# Patient Record
Sex: Female | Born: 1945 | Race: Black or African American | Hispanic: No | Marital: Married | Smoking: Never smoker
Health system: Southern US, Community
[De-identification: ages and names within clinical notes are randomized; demographics above are authoritative.]

## PROBLEM LIST (undated history)

## (undated) DIAGNOSIS — I639 Cerebral infarction, unspecified: Secondary | ICD-10-CM

## (undated) DIAGNOSIS — R519 Headache, unspecified: Secondary | ICD-10-CM

## (undated) DIAGNOSIS — R51 Headache: Secondary | ICD-10-CM

## (undated) DIAGNOSIS — E119 Type 2 diabetes mellitus without complications: Secondary | ICD-10-CM

## (undated) DIAGNOSIS — I10 Essential (primary) hypertension: Secondary | ICD-10-CM

## (undated) DIAGNOSIS — H539 Unspecified visual disturbance: Secondary | ICD-10-CM

## (undated) HISTORY — DX: Cerebral infarction, unspecified: I63.9

## (undated) HISTORY — DX: Unspecified visual disturbance: H53.9

## (undated) HISTORY — DX: Type 2 diabetes mellitus without complications: E11.9

## (undated) HISTORY — PX: CATARACT EXTRACTION: SUR2

## (undated) HISTORY — DX: Headache: R51

## (undated) HISTORY — PX: APPENDECTOMY: SHX54

## (undated) HISTORY — DX: Headache, unspecified: R51.9

## (undated) HISTORY — DX: Essential (primary) hypertension: I10

## (undated) HISTORY — PX: PARTIAL HYSTERECTOMY: SHX80

---

## 2014-10-04 ENCOUNTER — Telehealth: Payer: Self-pay | Admitting: Neurology

## 2014-10-04 NOTE — Telephone Encounter (Signed)
Pt called in needs a refill on Labetalol 100 mg to [facility] in [place]. She is scheduled for appointment with Dr Epimenio Foot on 11/01/14 Please call home number Thanks dg

## 2014-10-04 NOTE — Telephone Encounter (Signed)
I have spoken with Alicia Mcintyre.  She is a former pt. of Dr. Bonnita Hollow from [facility], but has not been seen here at [facility] yet.  I have advised that until she is seen here at [facility] we are not able to provide rx. refills.  She verbalized understanding of same/fim

## 2014-11-01 ENCOUNTER — Encounter: Payer: Self-pay | Admitting: Neurology

## 2014-11-01 ENCOUNTER — Ambulatory Visit (INDEPENDENT_AMBULATORY_CARE_PROVIDER_SITE_OTHER): Payer: Medicare HMO | Admitting: Neurology

## 2014-11-01 VITALS — BP 164/76 | HR 68 | Resp 16 | Ht 62.0 in | Wt 199.0 lb

## 2014-11-01 DIAGNOSIS — M79645 Pain in left finger(s): Secondary | ICD-10-CM | POA: Insufficient documentation

## 2014-11-01 DIAGNOSIS — I10 Essential (primary) hypertension: Secondary | ICD-10-CM | POA: Diagnosis not present

## 2014-11-01 DIAGNOSIS — E119 Type 2 diabetes mellitus without complications: Secondary | ICD-10-CM | POA: Diagnosis not present

## 2014-11-01 DIAGNOSIS — G4733 Obstructive sleep apnea (adult) (pediatric): Secondary | ICD-10-CM | POA: Diagnosis not present

## 2014-11-01 DIAGNOSIS — R269 Unspecified abnormalities of gait and mobility: Secondary | ICD-10-CM | POA: Diagnosis not present

## 2014-11-01 DIAGNOSIS — Z8673 Personal history of transient ischemic attack (TIA), and cerebral infarction without residual deficits: Secondary | ICD-10-CM | POA: Insufficient documentation

## 2014-11-01 MED ORDER — CLOPIDOGREL BISULFATE 75 MG PO TABS: 75.0000 mg | ORAL_TABLET | Freq: Every day | ORAL | Status: DC

## 2014-11-01 MED ORDER — TRAMADOL HCL 50 MG PO TABS: 50.0000 mg | ORAL_TABLET | Freq: Four times a day (QID) | ORAL | Status: DC | PRN

## 2014-11-01 NOTE — Progress Notes (Signed)
[facility]  PATIENT: Alicia Mcintyre DOB: September 09, 1945  REFERRING DOCTOR OR PCP:  Domingo Pulse [facility]) SOURCE: patient and records from [facility]  _________________________________   HISTORICAL  CHIEF COMPLAINT:  Chief Complaint  Patient presents with  . History of CVA    Denies new stroke sx.  Sts. she is compliant with Plavix and BP meds.  Sts. she is compliant with CPAP and sleeps well with it, no snoring with it and feels better during the day since starting CPAP.  Gets her supplies thru [employer]/[facility]  . Sleep Apnea    HISTORY OF PRESENT ILLNESS:  Alicia Mcintyre s a 69 yo woman who I have previously seen at [facility] for a h/o CVA and OSA.      CVA:   In 2012, she had a stroke with left-sided weakness and left-sided visual field loss. Her strength improved but she still notes that there is some weakness in the left leg if she walks a longer distance. For a while, she had numbness and tingling, especially in the left face but that has improved. The difficulties with her visual field have persisted. She does not note items to the left of midline.   She has been on Plavix 75 mg daily. She has not noted any more TIAs or strokes since the event 4 years ago.  OSA:   A polysomnogram 12/22/2012 showed severe OSA with an AHI equals 82. She was titrated to CPAP 10 cm. She is very compliant with CPAP using it for the entire night. On rare occasions she has worn the mask and she snores heavily. However, when she wears the mask, she has no snoring. She feels much better the next day when she wears the mask for the entire night. She denies any excessive daytime sleepiness. She has no difficulty tolerating the CPAP. She gets her supplies from [place].  Left Hand Pain:   She has noted pain in the left hand for about 3 months. The pain is located at the base of the left thumb. Pain is worse when she moves her hand. She notes that the joint seems to get stuck sometimes when  she moves the hand. There is not any significant pain in other joints of the hand or elsewhere she does not have any redness or warmth over the joint. Other joints and hand have normal appearance.      REVIEW OF SYSTEMS: Constitutional: No fevers, chills, sweats, or change in appetite Eyes: No visual changes, double vision, eye pain Ear, nose and throat: No hearing loss, ear pain, nasal congestion, sore throat Cardiovascular: No chest pain, palpitations Respiratory: No shortness of breath at rest or with exertion.   No wheezes GastrointestinaI: No nausea, vomiting, diarrhea, abdominal pain, fecal incontinence Genitourinary: No dysuria, urinary retention or frequency.  No nocturia. Musculoskeletal: No neck pain, back pain Integumentary: No rash, pruritus, skin lesions Neurological: as above Psychiatric: No depression at this time.  No anxiety Endocrine: No palpitations, diaphoresis, change in appetite, change in weigh or increased thirst Hematologic/Lymphatic: No anemia, purpura, petechiae. Allergic/Immunologic: No itchy/runny eyes, nasal congestion, recent allergic reactions, rashes  ALLERGIES: Allergies  Allergen Reactions  . Lipitor [Atorvastatin] Shortness Of Breath  . Zocor [Simvastatin] Shortness Of Breath  . Ace Inhibitors Cough  . Diovan [Valsartan] Cough  . Losartan Potassium Cough  . Metformin And Related Diarrhea  . Ranitidine Hcl Other (See Comments)    Headaches  . Actos [Pioglitazone] Rash    HOME MEDICATIONS:  Current outpatient prescriptions:  .  aspirin 81 MG chewable tablet, Chew 81 mg by mouth every other day., Disp: , Rfl:  .  canagliflozin (INVOKANA) 100 MG TABS tablet, Take 100 mg by mouth daily., Disp: , Rfl:  .  cholecalciferol (VITAMIN D) 1000 UNITS tablet, Take 5,000 Units by mouth daily., Disp: , Rfl:  .  clopidogrel (PLAVIX) 75 MG tablet, , Disp: , Rfl:  .  ferrous sulfate 325 (65 FE) MG tablet, Take 325 mg by mouth daily with lunch., Disp: , Rfl:   .  labetalol (NORMODYNE) 100 MG tablet, , Disp: , Rfl:  .  Omega-3 Fatty Acids (FISH OIL) 1000 MG CAPS, Take 1,000 mg by mouth 2 (two) times daily. Taking 2 capsules twice daily, Disp: , Rfl:  .  ONE TOUCH ULTRA TEST test strip, , Disp: , Rfl:  .  TRADJENTA 5 MG TABS tablet, , Disp: , Rfl:  .  vitamin B-12 (CYANOCOBALAMIN) 1000 MCG tablet, Take 1,000 mcg by mouth daily., Disp: , Rfl:   PAST MEDICAL HISTORY: Past Medical History  Diagnosis Date  . Diabetes mellitus without complication (HCC)   . Headache   . Hypertension   . Stroke (HCC)   . Vision abnormalities     PAST SURGICAL HISTORY: Past Surgical History  Procedure Laterality Date  . Partial hysterectomy      has one ovary left/fim  . Appendectomy      FAMILY HISTORY: Family History  Problem Relation Age of Onset  . Hypertension Mother   . Stroke Mother   . Lupus Mother   . Hypertension Father   . Hypertension Sister   . Sleep apnea Sister   . Stroke Sister   . Diabetes Brother   . Hypertension Brother   . Healthy Brother   . Diabetes Brother   . Healthy Brother   . Healthy Brother   . Diabetes Sister   . Healthy Sister   . Healthy Sister   . Sleep apnea Sister   . Hypertension Sister     SOCIAL HISTORY:  Social History   Social History  . Marital Status: Married    Spouse Name: N/A  . Number of Children: N/A  . Years of Education: N/A   Occupational History  . Not on file.   Social History Main Topics  . Smoking status: Never Smoker   . Smokeless tobacco: Not on file  . Alcohol Use: No  . Drug Use: No  . Sexual Activity: Not on file   Other Topics Concern  . Not on file   Social History Narrative  . No narrative on file     PHYSICAL EXAM  Filed Vitals:   11/01/14 0933  BP: 164/76  Pulse: 68  Resp: 16  Height:  (1.575 m)  Weight: 199 lb (90.266 kg)    Body mass index is 36.39 kg/(m^2).   General: The patient is well-developed and well-nourished and in no acute  distress   Skin: Extremities are without significant edema.  Musculoskeletal:  Tender over 1st MCP joint of left hand.     Neurologic Exam  Mental status: The patient is alert and oriented x 3 at the time of the examination. The patient has apparent normal recent and remote memory, with an apparently normal attention span and concentration ability.   Speech is normal.  Cranial nerves: Extraocular movements are full.    Facial symmetry is present. There is allodynia over left V2 distribution.   Facial strength is normal.  Trapezius and sternocleidomastoid strength is  normal. No dysarthria is noted.  The tongue is midline, and the patient has symmetric elevation of the soft palate. No obvious hearing deficits are noted.  Motor:  Muscle bulk is normal.   Tone is normal. Strength is  5 / 5 in all 4 extremities.   Sensory: Sensory testing is intact to pinprick, soft touch and vibration sensation in all 4 extremities.  Coordination: Cerebellar testing reveals good finger-nose-finger and heel-to-shin bilaterally.  Gait and station: Station is normal.   Gait is normal. Tandem gait is normal. Romberg is negative.   Reflexes: Deep tendon reflexes are symmetric and normal bilaterally.   Plantar responses are flexor.    DIAGNOSTIC DATA (LABS, IMAGING, TESTING) - I reviewed patient records, labs, notes, testing and imaging myself where available.     ASSESSMENT AND PLAN  Diabetes mellitus without complication (HCC)  Benign hypertension  OSA (obstructive sleep apnea)  History of CVA (cerebrovascular accident)  Gait disturbance  Pain of left thumb  1.   Inject left first MCP joint  With 1.5 mg Decadron in 0.8 mL Marcaine. 2.   Renew Plavix 3.   Tramadol 50 mg po prn (up to tid). 4.   rtc 6 months, sooner if new or worsening neurologic issues   Justus Duerr A. Epimenio FootSater, MD, PhD 11/01/2014, 9:38 AM Certified in Neurology, Clinical Neurophysiology, Sleep Medicine, Pain Medicine and  Neuroimaging  [facility] [address], [address] [place], [place] [address] [phone]

## 2015-02-02 ENCOUNTER — Inpatient Hospital Stay (HOSPITAL_COMMUNITY)
Admission: EM | Admit: 2015-02-02 | Discharge: 2015-02-04 | DRG: 065 | Disposition: A | Discharge status: Home / Self Care | Payer: Medicare HMO | Attending: Family Medicine | Admitting: Family Medicine

## 2015-02-02 ENCOUNTER — Inpatient Hospital Stay (HOSPITAL_COMMUNITY): Payer: Medicare HMO

## 2015-02-02 ENCOUNTER — Encounter (HOSPITAL_COMMUNITY): Payer: Self-pay | Admitting: *Deleted

## 2015-02-02 ENCOUNTER — Emergency Department (HOSPITAL_COMMUNITY): Payer: Medicare HMO

## 2015-02-02 DIAGNOSIS — I6930 Unspecified sequelae of cerebral infarction: Secondary | ICD-10-CM | POA: Diagnosis not present

## 2015-02-02 DIAGNOSIS — E669 Obesity, unspecified: Secondary | ICD-10-CM | POA: Diagnosis present

## 2015-02-02 DIAGNOSIS — G4733 Obstructive sleep apnea (adult) (pediatric): Secondary | ICD-10-CM | POA: Diagnosis present

## 2015-02-02 DIAGNOSIS — I6330 Cerebral infarction due to thrombosis of unspecified cerebral artery: Secondary | ICD-10-CM | POA: Diagnosis present

## 2015-02-02 DIAGNOSIS — R4781 Slurred speech: Secondary | ICD-10-CM | POA: Diagnosis present

## 2015-02-02 DIAGNOSIS — Z87440 Personal history of urinary (tract) infections: Secondary | ICD-10-CM

## 2015-02-02 DIAGNOSIS — Z66 Do not resuscitate: Secondary | ICD-10-CM | POA: Diagnosis present

## 2015-02-02 DIAGNOSIS — Z9189 Other specified personal risk factors, not elsewhere classified: Secondary | ICD-10-CM

## 2015-02-02 DIAGNOSIS — Z888 Allergy status to other drugs, medicaments and biological substances status: Secondary | ICD-10-CM | POA: Diagnosis not present

## 2015-02-02 DIAGNOSIS — E1142 Type 2 diabetes mellitus with diabetic polyneuropathy: Secondary | ICD-10-CM | POA: Diagnosis present

## 2015-02-02 DIAGNOSIS — D563 Thalassemia minor: Secondary | ICD-10-CM | POA: Diagnosis present

## 2015-02-02 DIAGNOSIS — I639 Cerebral infarction, unspecified: Secondary | ICD-10-CM | POA: Diagnosis present

## 2015-02-02 DIAGNOSIS — D509 Iron deficiency anemia, unspecified: Secondary | ICD-10-CM | POA: Diagnosis present

## 2015-02-02 DIAGNOSIS — R531 Weakness: Secondary | ICD-10-CM | POA: Diagnosis not present

## 2015-02-02 DIAGNOSIS — I69354 Hemiplegia and hemiparesis following cerebral infarction affecting left non-dominant side: Secondary | ICD-10-CM | POA: Diagnosis not present

## 2015-02-02 DIAGNOSIS — G119 Hereditary ataxia, unspecified: Secondary | ICD-10-CM | POA: Diagnosis not present

## 2015-02-02 DIAGNOSIS — R471 Dysarthria and anarthria: Secondary | ICD-10-CM | POA: Diagnosis present

## 2015-02-02 DIAGNOSIS — E785 Hyperlipidemia, unspecified: Secondary | ICD-10-CM | POA: Diagnosis present

## 2015-02-02 DIAGNOSIS — Z6836 Body mass index (BMI) 36.0-36.9, adult: Secondary | ICD-10-CM | POA: Diagnosis not present

## 2015-02-02 DIAGNOSIS — Z7982 Long term (current) use of aspirin: Secondary | ICD-10-CM

## 2015-02-02 DIAGNOSIS — E118 Type 2 diabetes mellitus with unspecified complications: Secondary | ICD-10-CM | POA: Insufficient documentation

## 2015-02-02 DIAGNOSIS — Z7902 Long term (current) use of antithrombotics/antiplatelets: Secondary | ICD-10-CM | POA: Diagnosis not present

## 2015-02-02 DIAGNOSIS — R2681 Unsteadiness on feet: Secondary | ICD-10-CM

## 2015-02-02 DIAGNOSIS — M6289 Other specified disorders of muscle: Secondary | ICD-10-CM | POA: Diagnosis not present

## 2015-02-02 DIAGNOSIS — I6990 Unspecified sequelae of unspecified cerebrovascular disease: Secondary | ICD-10-CM

## 2015-02-02 DIAGNOSIS — I6789 Other cerebrovascular disease: Secondary | ICD-10-CM | POA: Diagnosis not present

## 2015-02-02 DIAGNOSIS — I10 Essential (primary) hypertension: Secondary | ICD-10-CM | POA: Diagnosis present

## 2015-02-02 DIAGNOSIS — H53462 Homonymous bilateral field defects, left side: Secondary | ICD-10-CM

## 2015-02-02 LAB — CBC WITH DIFFERENTIAL/PLATELET
BASOS PCT: 0 %
Basophils Absolute: 0 10*3/uL (ref 0.0–0.1)
EOS PCT: 1 %
Eosinophils Absolute: 0.1 10*3/uL (ref 0.0–0.7)
HEMATOCRIT: 35.7 % — AB (ref 36.0–46.0)
Hemoglobin: 11.3 g/dL — ABNORMAL LOW (ref 12.0–15.0)
LYMPHS ABS: 0.6 10*3/uL — AB (ref 0.7–4.0)
Lymphocytes Relative: 10 %
MCH: 21.8 pg — AB (ref 26.0–34.0)
MCHC: 31.7 g/dL (ref 30.0–36.0)
MCV: 68.9 fL — AB (ref 78.0–100.0)
MONO ABS: 0.7 10*3/uL (ref 0.1–1.0)
MONOS PCT: 11 %
NEUTROS ABS: 5 10*3/uL (ref 1.7–7.7)
Neutrophils Relative %: 78 %
PLATELETS: 157 10*3/uL (ref 150–400)
RBC: 5.18 MIL/uL — AB (ref 3.87–5.11)
RDW: 15.9 % — AB (ref 11.5–15.5)
WBC: 6.4 10*3/uL (ref 4.0–10.5)

## 2015-02-02 LAB — COMPREHENSIVE METABOLIC PANEL
ALT: 16 U/L (ref 14–54)
AST: 16 U/L (ref 15–41)
Albumin: 3.4 g/dL — ABNORMAL LOW (ref 3.5–5.0)
Alkaline Phosphatase: 57 U/L (ref 38–126)
Anion gap: 9 (ref 5–15)
BILIRUBIN TOTAL: 0.5 mg/dL (ref 0.3–1.2)
BUN: 14 mg/dL (ref 6–20)
CHLORIDE: 108 mmol/L (ref 101–111)
CO2: 23 mmol/L (ref 22–32)
Calcium: 9.6 mg/dL (ref 8.9–10.3)
Creatinine, Ser: 1.28 mg/dL — ABNORMAL HIGH (ref 0.44–1.00)
GFR, EST AFRICAN AMERICAN: 49 mL/min — AB (ref >60)
GFR, EST NON AFRICAN AMERICAN: 42 mL/min — AB (ref >60)
Glucose, Bld: 147 mg/dL — ABNORMAL HIGH (ref 65–99)
POTASSIUM: 4.5 mmol/L (ref 3.5–5.1)
Sodium: 140 mmol/L (ref 135–145)
TOTAL PROTEIN: 7.4 g/dL (ref 6.5–8.1)

## 2015-02-02 LAB — LIPID PANEL
Cholesterol: 220 mg/dL — ABNORMAL HIGH (ref 0–200)
HDL: 66 mg/dL (ref >40)
LDL Cholesterol: 139 mg/dL — ABNORMAL HIGH (ref 0–99)
Total CHOL/HDL Ratio: 3.3 RATIO
Triglycerides: 74 mg/dL (ref <150)
VLDL: 15 mg/dL (ref 0–40)

## 2015-02-02 LAB — GLUCOSE, CAPILLARY: Glucose-Capillary: 94 mg/dL (ref 65–99)

## 2015-02-02 LAB — ETHANOL: Alcohol, Ethyl (B): <5 mg/dL

## 2015-02-02 LAB — I-STAT TROPONIN, ED: TROPONIN I, POC: 0 ng/mL (ref 0.00–0.08)

## 2015-02-02 LAB — PROTIME-INR
INR: 1.14 (ref 0.00–1.49)
Prothrombin Time: 14.8 s (ref 11.6–15.2)

## 2015-02-02 LAB — APTT: aPTT: 24 s (ref 24–37)

## 2015-02-02 MED ORDER — CLOPIDOGREL BISULFATE 75 MG PO TABS
75.0000 mg | ORAL_TABLET | Freq: Every day | ORAL | Status: DC
  Administered 2015-02-03 – 2015-02-04 (×2): 75 mg via ORAL
  Filled 2015-02-02 (×2): qty 1

## 2015-02-02 MED ORDER — ASPIRIN EC 81 MG PO TBEC
81.0000 mg | DELAYED_RELEASE_TABLET | Freq: Every day | ORAL | Status: DC
  Administered 2015-02-03 – 2015-02-04 (×3): 81 mg via ORAL
  Filled 2015-02-02 (×3): qty 1

## 2015-02-02 MED ORDER — IOHEXOL 350 MG/ML SOLN
80.0000 mL | Freq: Once | INTRAVENOUS | Status: AC | PRN
  Administered 2015-02-02: 100 mL via INTRAVENOUS

## 2015-02-02 NOTE — ED Notes (Signed)
Pt still in MRI, called MRI and she has about 15 more minutes

## 2015-02-02 NOTE — ED Notes (Signed)
Pt off unit for MRI

## 2015-02-02 NOTE — ED Notes (Signed)
Pt arrives from [facility] via [facility]. Pt states she went to bed at 1030 last night at baseline (minimal left sided weakness and slightly slurred speech). Pt woke up this morning at 0730 with increased slurred speech and left sided weakness. Pt states it was hard to move her left side and her husband noted her speech was more slurred than normal. Pt was seen at [facility] on Monday for a UTI and is currently on antibiotics. EMS reports grip strengths improved slightly en route.

## 2015-02-02 NOTE — H&P (Addendum)
Patient seen at 5pm today. I will cosign resident's note once completed.  70 Y/O F with pmx of CVA in 2012 with residual left sided weakness, TIA in 2015, HTN, DM2, OSA presented with history of left sided weakness and slurring of her speech which is worse from her baseline. She stated she woke up this morning and noticed that she was unable to raise her left foot and her left arm was tight and heavy. She denies fall, no headache, no change in her vision. Her speech is slurred, she does have left facial weakness which is baseline for her but her slurring is new per patient and her family members who were also present at her bedside per her request. Denies chest pain, no SOB.   No current facility-administered medications on file prior to encounter.   Current Outpatient Prescriptions on File Prior to Encounter  Medication Sig Dispense Refill  . aspirin 81 MG chewable tablet Chew 81 mg by mouth every other day.    . canagliflozin (INVOKANA) 100 MG TABS tablet Take 100 mg by mouth daily.    . cholecalciferol (VITAMIN D) 1000 UNITS tablet Take 5,000 Units by mouth daily.    . clopidogrel (PLAVIX) 75 MG tablet Take 1 tablet (75 mg total) by mouth daily. 30 tablet 11  . ferrous sulfate 325 (65 FE) MG tablet Take 325 mg by mouth daily with lunch.    . labetalol (NORMODYNE) 100 MG tablet Take 50 mg by mouth 2 (two) times daily.     . Omega-3 Fatty Acids (FISH OIL) 1000 MG CAPS Take 1,000 mg by mouth 2 (two) times daily. Taking 2 capsules twice daily    . ONE TOUCH ULTRA TEST test strip     . TRADJENTA 5 MG TABS tablet Take 5 mg by mouth daily.     . traMADol (ULTRAM) 50 MG tablet Take 1 tablet (50 mg total) by mouth every 6 (six) hours as needed. 90 tablet 3  . vitamin B-12 (CYANOCOBALAMIN) 1000 MCG tablet Take 1,000 mcg by mouth daily.     Past Medical History  Diagnosis Date  . Diabetes mellitus without complication (HCC)   . Headache   . Hypertension   . Stroke (HCC)   . Vision abnormalities     Filed Vitals:   02/02/15 1715 02/02/15 1800 02/02/15 1900 02/02/15 1915  BP: 142/70 147/65 127/93 137/56  Pulse: 63 63 71 65  Temp:      TempSrc:      Resp:   14 22  Height:      Weight:      SpO2: 98% 99% 99% 97%   Review of system: As essentially in the body of history,  no gastrointestinal or respiratory or cardiac symptoms.  Ct Head Wo Contrast  02/02/2015  CLINICAL DATA:  Left leg weakness since this morning. Previous stroke in 2012 with left-sided weakness. EXAM: CT HEAD WITHOUT CONTRAST TECHNIQUE: Contiguous axial images were obtained from the base of the skull through the vertex without intravenous contrast. COMPARISON:  None. FINDINGS: Old right parieto-occipital infarct. Smaller old infarct in a similar location on the left, medially. Old bilateral cerebellar hemisphere infarcts. Small, old right frontal infarct. Small, old or subacute left frontoparietal infarct. Patchy white matter low density in both cerebral hemispheres. Normal size and position of the ventricles with the exception of ex vacuo enlargement of the occipital horn of the right lateral ventricle. No intracranial hemorrhage or mass effect. Unremarkable bones. Mild bilateral maxillary sinus mucosal thickening. IMPRESSION:  1. Small, old or subacute left frontoparietal infarct and old bilateral cerebral and cerebellar infarcts with no acute infarct or intracranial hemorrhage identified. 2. Mild to moderate chronic small vessel white matter ischemic changes in both cerebral hemispheres. 3. Mild chronic bilateral maxillary sinusitis. Electronically Signed   By: Beckie Salts M.D.   On: 02/02/2015 14:59   EKG reviewed.  Exam:  Gen: Calm in bed, not in distress.  HEENT: EOMI, PERRLA, Left facial asymmetry. [place]/AT.  Neuro: Awake and alert, oriented x 3. Gait not assessed due to fall risk. Left limb power of about 3/5 compared to the right limbs. No sensory loss. DTR ++ globally.  Resp: Air entry equal and CTA B/L.  Heart: S1  S2 normal, no murmur. RRR.  Abdomen: Soft, NT.ND, BS+ and normal.  Ext: No edema.   A/P:  1. Left limb weakness + dysarthria. TIA vs CVA.   CT head reviewed with no acute stroke.   MRI/MRA recommended.   Obtain risk stratification lab.   Complete stroke work up to include ECHO.   Consult PT/OP and speech. NPO pending swallow eval.   Fall precaution recommended.   Neurology consulted.   Continue ASA and Plavix. Might need to take ASA daily with Plavix instead of 2-3 times per week if not contraindicated or no bleeding risk.   Consider Statin based on FLP result. Patient currently not on Statin.      2. DM2/HTN:   Seems under control per patient.   Obtain A1C.   May hold antihypertensive for now. BP looks fine and she will benefit from permissive HTN for the next 24-48 hs.   Review home DM regimen and restart as appropriate.   SSI placement.       EKG reviewed, troponin negative. Continue monitoring cardiac activity on telemetry. Monitor vital sign.     Signed off to Dr. Perley Jain 02/03/15 at 8am

## 2015-02-02 NOTE — H&P (Signed)
Family Medicine Teaching [facility] Admission History and Physical Service Pager: [phone]  Patient name: Alicia Mcintyre Medical record number: [id number] Date of birth: [birth year] Age: 70 y.o. Gender: female  Primary Care Provider: No primary care provider on file. Consultants: Neurology Code Status: DNR/DNI  Chief Complaint: Left lower extremity weakness and dysarthria  Assessment and Plan: Alicia Mcintyre is a 70 y.o. female presenting with left lower extremity weakness and dysarthria that has resolved, concerning for TIA. PMH is significant for HTN, T2DM, Hx CVA in 2012 and 2015 with residual left sided weakness.  Left lower extremity weakness and dysarthria: Like secondary to a TIA, given that the symptoms resolved by the time they got to the ED. She has a history of strokes in the past in 2012 and 2015 and follows with a Neurologist. On exam, she has mild weakness in the left upper and lower extremities that may be her baseline. She has notable incoordination with finger-to-nose and heel-to-shin on the left. We are unclear if this is new or baseline for her. She may have a cerebellar lesion. CT head showed old strokes but no new stroke or bleed. - Admit to med-surg, attending Dr. Lum Babe.  - Continue ASA  every other day and Plavix  daily - Neurology consulted, appreciate recommendations - MRI brain pending - Carotid dopplers - ECHO - Risk stratification labs: HgbA1c, lipid panel, TSH - NPO until passes bedside swallow - PT/OT/SLP - Cardiac monitoring with continuous pulse ox - Neuro checks q2hrs x 12 hrs, then q4hrs  Microcytic Anemia: Takes ferrous sulfate  daily at home. Hgb 11.3, MCV 68.9 in the ED. - Will order an iron panel  HTN: Takes Labetalol  PO bid at home. BP 142/70 in the ED. - Holding home BP meds in the setting of permissive hypertension  T2DM: No A1c on file. Glucose 147 in the ED. - Continue home meds: Invokana  daily and Tradjenta   daily - Sensitive SSI - CBGs with meals and at bedtime - Hemoglobin A1c   FEN/GI: Heart healthy diet, SLIV Prophylaxis: Heparin sq  Disposition: Admit to FMTS, attending Dr. Lum Babe.  History of Present Illness:  Alicia Mcintyre is a 70 y.o. female presenting with left lower extremity weakness and dysarthria. The left lower extremity weakness started at 7:30am when she was trying to get out of bed. She felt like her left leg was "glued to the floor". She was not having any left arm weakness. Between 9:30-10:00am, her husband noticed that her eyes were "shiny". Her speech then started becoming slurred and her mouth was a little "twisted". Her husband took her to the PCP's office (Dr. Jethro Poling, [facility]). The PCP sent her to the ED. She states she feels "fine" now. Her husband feels like she is "a lot better now". She is not having any arm weakness on the left. She was not having any numbness or changes in vision. No headaches.  She has a history of strokes. The last one was in 2012. She also had a "mini-stroke" in 2015. She sees Dr. Epimenio Foot with [facility]. She takes the ASA every other day and Plavix every day.   Her blood sugars have been elevated recently. She had a UTI recently and was prescribed Bactrim. She was not having any abdominal pain, fevers, dysuria, or urinary frequency. She endorses a little constipation. No rashes.  In the ED, CBC showed a hemoglobin of 11.3 and an MCV of 68.9. CMP was significant for a creatinine of 1.28. I-stat troponin  was 0.00. CT head showed small old/subacute left frontoparietal infarct and old bilateral cerebral and cerebellar infarcts with no new infarct or intracranial hemorrhage. Neurology was consulted but had not yet seen her.  Review Of Systems: Per HPI with the following additions: See HPI for pertinent positives and negatives. Otherwise the remainder of the systems were negative.  Patient Active Problem List   Diagnosis Date Noted  .  Diabetes mellitus without complication (HCC) 11/01/2014  . Benign hypertension 11/01/2014  . OSA (obstructive sleep apnea) 11/01/2014  . History of CVA (cerebrovascular accident) 11/01/2014  . Gait disturbance 11/01/2014  . Pain of left thumb 11/01/2014    Past Medical History: Past Medical History  Diagnosis Date  . Diabetes mellitus without complication (HCC)   . Headache   . Hypertension   . Stroke (HCC)   . Vision abnormalities     Past Surgical History: Past Surgical History  Procedure Laterality Date  . Partial hysterectomy      has one ovary left/fim  . Appendectomy      Social History: Social History  Substance Use Topics  . Smoking status: Never Smoker   . Smokeless tobacco: None  . Alcohol Use: No   Additional social history: Has never used alcohol or cigarettes. Please also refer to relevant sections of EMR.  Family History: Family History  Problem Relation Age of Onset  . Hypertension Mother   . Stroke Mother   . Lupus Mother   . Hypertension Father   . Hypertension Sister   . Sleep apnea Sister   . Stroke Sister   . Diabetes Brother   . Hypertension Brother   . Healthy Brother   . Diabetes Brother   . Healthy Brother   . Healthy Brother   . Diabetes Sister   . Healthy Sister   . Healthy Sister   . Sleep apnea Sister   . Hypertension Sister     Allergies and Medications: Allergies  Allergen Reactions  . Lipitor [Atorvastatin] Shortness Of Breath  . Zocor [Simvastatin] Shortness Of Breath  . Ace Inhibitors Cough  . Diovan [Valsartan] Cough  . Losartan Potassium Cough  . Metformin And Related Diarrhea  . Ranitidine Hcl Other (See Comments)    Headaches  . Actos [Pioglitazone] Rash   No current facility-administered medications on file prior to encounter.   Current Outpatient Prescriptions on File Prior to Encounter  Medication Sig Dispense Refill  . aspirin 81 MG chewable tablet Chew 81 mg by mouth every other day.    .  canagliflozin (INVOKANA) 100 MG TABS tablet Take 100 mg by mouth daily.    . cholecalciferol (VITAMIN D) 1000 UNITS tablet Take 5,000 Units by mouth daily.    . clopidogrel (PLAVIX) 75 MG tablet Take 1 tablet (75 mg total) by mouth daily. 30 tablet 11  . ferrous sulfate 325 (65 FE) MG tablet Take 325 mg by mouth daily with lunch.    . labetalol (NORMODYNE) 100 MG tablet Take 50 mg by mouth 2 (two) times daily.     . Omega-3 Fatty Acids (FISH OIL) 1000 MG CAPS Take 1,000 mg by mouth 2 (two) times daily. Taking 2 capsules twice daily    . ONE TOUCH ULTRA TEST test strip     . TRADJENTA 5 MG TABS tablet Take 5 mg by mouth daily.     . traMADol (ULTRAM) 50 MG tablet Take 1 tablet (50 mg total) by mouth every 6 (six) hours as needed. 90 tablet 3  .  vitamin B-12 (CYANOCOBALAMIN) 1000 MCG tablet Take 1,000 mcg by mouth daily.      Objective: BP 145/74 mmHg  Pulse 73  Temp(Src) 99.2 F (37.3 C) (Oral)  Resp 22  Ht  (1.575 m)  Wt 202 lb (91.627 kg)  BMI 36.94 kg/m2  SpO2 99% Exam: General: Well-appearing female, in NAD, talktative Eyes: EOMI, PERRLA ENTM: Oropharynx clear, MMM Neck: Supple Cardiovascular: RRR, no murmurs Respiratory: CTAB, normal work of breathing Abdomen: +BS, soft, non-tender, non-distended MSK: Warm and well-perfused, no edema Skin: No rashes or lesions Neuro: Awake, alert, CN 2-12 intact, smile symmetric, no facial droop, sensation normal throughout, 4/5 muscle strength in the left upper and lower extremity, 5/5 muscle strength in the right upper and lower extremity, finger-to-nose normal on the right and uncoordinated on the left, heel-to-shin normal on the right and uncoordinated on the left, rapid alternating movements normal on the right and slower on the left. Psych: Appropriate affect, normal behavior  Labs and Imaging: CBC BMET   Recent Labs Lab 02/02/15 1350  WBC 6.4  HGB 11.3*  HCT 35.7*  PLT 157    Recent Labs Lab 02/02/15 1350  NA 140  K  4.5  CL 108  CO2 23  BUN 14  CREATININE 1.28*  GLUCOSE 147*  CALCIUM 9.6     I-stat troponin 0.00 CT head: Small old/subacute left frontoparietal infarct and old bilateral cerebral and cerebellar infarcts with no new infarct or intracranial hemorrhage.  Campbell Stall, MD 02/02/2015, 3:48 PM PGY-1, [facility] FPTS Intern pager: [phone], text pages welcome

## 2015-02-02 NOTE — Consult Note (Addendum)
Requesting Physician: Gaylyn Rong, PA in ER    Reason for consultation: Evaluate for stroke  HPI:                                                                                                                                         Alicia Mcintyre is an 70 y.o. 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 When she woke up this morning at 7 AM, she noticed her left leg had poor coordination and was not doing what she wanted to do her has been also noticed worsening slurred speech. She'll brought into the ER for further evaluation. Her husband feels that the left-sided symptoms of poor coordination and slurred speech are worse than her baseline.  She is on aspirin and Plavix at home.  hasn't been diagnosed with atrial fibrillation per history. reports good compliance with the medications  Date last known well:  02/01/15 Time last known well:  10:30 pm tPA Given: No: Outside of the IV TPA time window  Stroke Risk Factors - diabetes mellitus and hypertension  Past Medical History: Past Medical History  Diagnosis Date  . Diabetes mellitus without complication (HCC)   . Headache   . Hypertension   . Stroke (HCC)   . Vision abnormalities     Past Surgical History  Procedure Laterality Date  . Partial hysterectomy      has one ovary left/fim  . Appendectomy      Family History: Family History  Problem Relation Age of Onset  . Hypertension Mother   . Stroke Mother   . Lupus Mother   . Hypertension Father   . Hypertension Sister   . Sleep apnea Sister   . Stroke Sister   . Diabetes Brother   . Hypertension Brother   . Healthy Brother   . Diabetes Brother   . Healthy Brother   . Healthy Brother   . Diabetes Sister   . Healthy Sister   . Healthy Sister   . Sleep apnea Sister   . Hypertension  Sister     Social History:   reports that she has never smoked. She does not have any smokeless tobacco history on file. She reports that she does not drink alcohol or use illicit drugs.  Allergies:  Allergies  Allergen Reactions  . Lipitor [Atorvastatin] Shortness Of Breath  . Zocor [Simvastatin] Shortness Of Breath  . Ace Inhibitors Cough  . Diovan [Valsartan] Cough  . Losartan Potassium Cough  . Metformin And Related Diarrhea  . Ranitidine Hcl Other (See Comments)    Headaches  . Actos [Pioglitazone] Rash     Medications:  No current facility-administered medications for this encounter.  Current outpatient prescriptions:  .  aspirin 81 MG chewable tablet, Chew 81 mg by mouth every other day., Disp: , Rfl:  .  canagliflozin (INVOKANA) 100 MG TABS tablet, Take 100 mg by mouth daily., Disp: , Rfl:  .  cholecalciferol (VITAMIN D) 1000 UNITS tablet, Take 5,000 Units by mouth daily., Disp: , Rfl:  .  clopidogrel (PLAVIX) 75 MG tablet, Take 1 tablet (75 mg total) by mouth daily., Disp: 30 tablet, Rfl: 11 .  ferrous sulfate 325 (65 FE) MG tablet, Take 325 mg by mouth daily with lunch., Disp: , Rfl:  .  labetalol (NORMODYNE) 100 MG tablet, Take 50 mg by mouth 2 (two) times daily. , Disp: , Rfl:  .  Omega-3 Fatty Acids (FISH OIL) 1000 MG CAPS, Take 1,000 mg by mouth 2 (two) times daily. Taking 2 capsules twice daily, Disp: , Rfl:  .  ONE TOUCH ULTRA TEST test strip, , Disp: , Rfl:  .  sulfamethoxazole-trimethoprim (BACTRIM DS,SEPTRA DS) 800-160 MG tablet, Take 1 tablet by mouth 2 (two) times daily. Filled on 01-31-15 for 10 days, Disp: , Rfl:  .  TRADJENTA 5 MG TABS tablet, Take 5 mg by mouth daily. , Disp: , Rfl:  .  traMADol (ULTRAM) 50 MG tablet, Take 1 tablet (50 mg total) by mouth every 6 (six) hours as needed., Disp: 90 tablet, Rfl: 3 .  vitamin B-12 (CYANOCOBALAMIN)  1000 MCG tablet, Take 1,000 mcg by mouth daily., Disp: , Rfl:    ROS:                                                                                                                                       History obtained from the patient  General ROS: negative for - chills, fatigue, fever, night sweats, weight gain or weight loss Psychological ROS: negative for - behavioral disorder, hallucinations, memory difficulties, mood swings or suicidal ideation Ophthalmic ROS: negative for - blurry vision, double vision, eye pain or loss of vision ENT ROS: negative for - epistaxis, nasal discharge, oral lesions, sore throat, tinnitus or vertigo Allergy and Immunology ROS: negative for - hives or itchy/watery eyes Hematological and Lymphatic ROS: negative for - bleeding problems, bruising or swollen lymph nodes Endocrine ROS: negative for - galactorrhea, hair pattern changes, polydipsia/polyuria or temperature intolerance Respiratory ROS: negative for - cough, hemoptysis, shortness of breath or wheezing Cardiovascular ROS: negative for - chest pain, dyspnea on exertion, edema or irregular heartbeat Gastrointestinal ROS: negative for - abdominal pain, diarrhea, hematemesis, nausea/vomiting or stool incontinence Genito-Urinary ROS: negative for - dysuria, hematuria, incontinence or urinary frequency/urgency Musculoskeletal ROS: negative for - joint swelling or muscular weakness Neurological ROS: as noted in HPI Dermatological ROS: negative for rash and skin lesion changes  Neurologic Examination:  Blood pressure 142/70, pulse 63, temperature 99.2 F (37.3 C), temperature source Oral, resp. rate 18, height  (1.575 m), weight 91.627 kg (202 lb), SpO2 98 %.  Evaluation of higher integrative functions including: Level of alertness: Alert,  Oriented to time, place and person Recent and remote memory -  intact   Attention span and concentration  - intact   Speech: Mild labial or lingual dysarthria noted, no aphasia noted.  Test the following cranial nerves: 2-12 grossly intact, except for dense left homonymous hemianopia  Motor examination:  Mild increased tone in the left upper and lower extremity, spasticity from prior strokes. full 5/5 motor strength in  right upper and lower extremities, about 4-5 strength with left deltoid, minimal 5-/5 weakness of left biceps triceps and wrist flexion, minimal giveaway with the left hip flexor, full strength distally .  Examination of sensation :  Mildly reduced sensation on the left compared to right side, with superimposed length dependent sensory loss from diabetic polyneuropathy  Examination of deep tendon reflexes: 3+,  left upper extremity, 2+ on the right, absent in lower extremities secondary to diabetic polyneuropathy, normal plantars bilaterally Test coordination: left upper extremity mild appendicular ataxia noted with  finger nose testing,  normal on right. Mild left lower extremity ataxia noted with heel-to-shin testing as well .  No abnormal involuntary movements or tremors noted.  Gait: Deferred    Lab Results: Basic Metabolic Panel:  Recent Labs Lab 02/02/15 1350  NA 140  K 4.5  CL 108  CO2 23  GLUCOSE 147*  BUN 14  CREATININE 1.28*  CALCIUM 9.6    Liver Function Tests:  Recent Labs Lab 02/02/15 1350  AST 16  ALT 16  ALKPHOS 57  BILITOT 0.5  PROT 7.4  ALBUMIN 3.4*   No results for input(s): LIPASE, AMYLASE in the last 168 hours. No results for input(s): AMMONIA in the last 168 hours.  CBC:  Recent Labs Lab 02/02/15 1350  WBC 6.4  NEUTROABS 5.0  HGB 11.3*  HCT 35.7*  MCV 68.9*  PLT 157    Cardiac Enzymes: No results for input(s): CKTOTAL, CKMB, CKMBINDEX, TROPONINI in the last 168 hours.  Lipid Panel: No results for input(s): CHOL, TRIG, HDL, CHOLHDL, VLDL, LDLCALC in the last 168 hours.  CBG: No  results for input(s): GLUCAP in the last 168 hours.  Microbiology: No results found for this or any previous visit.   Imaging: Ct Head Wo Contrast  02/02/2015  CLINICAL DATA:  Left leg weakness since this morning. Previous stroke in 2012 with left-sided weakness. EXAM: CT HEAD WITHOUT CONTRAST TECHNIQUE: Contiguous axial images were obtained from the base of the skull through the vertex without intravenous contrast. COMPARISON:  None. FINDINGS: Old right parieto-occipital infarct. Smaller old infarct in a similar location on the left, medially. Old bilateral cerebellar hemisphere infarcts. Small, old right frontal infarct. Small, old or subacute left frontoparietal infarct. Patchy white matter low density in both cerebral hemispheres. Normal size and position of the ventricles with the exception of ex vacuo enlargement of the occipital horn of the right lateral ventricle. No intracranial hemorrhage or mass effect. Unremarkable bones. Mild bilateral maxillary sinus mucosal thickening. IMPRESSION: 1. Small, old or subacute left frontoparietal infarct and old bilateral cerebral and cerebellar infarcts with no acute infarct or intracranial hemorrhage identified. 2. Mild to moderate chronic small vessel white matter ischemic changes in both cerebral hemispheres. 3. Mild chronic bilateral maxillary sinusitis. Electronically Signed   By: Zada Finders.D.  On: 02/02/2015 14:59    Assessment and plan:   Alicia Mcintyre is an 70 y.o. female patient  with multiple prior embolic infarcts as seen on the CT of the brain including a sizable right PCA chronic infarct, a small left MCA peripheral infarct, also right MCA small multiple infarcts. Patient reported having these strokes since 2012, the last one was in 2015, with residual neurological symptoms from her prior strokes in the form of left hemianopia, mild dysarthria , left mild spastic hemiparesis, gait instability. She does appear to have mild left hemiataxia  with finger nose testing and heel shin testing which her husband reports to be new. NIHSS 10 during my evaluation in ER.  Patient will be admitted to hospitalist service for further neurodiagnostic evaluation. Continue aspirin and Plavix, check A1c, lipid profile. Recommend MRI of the brain without contrast to evaluate for acute strokes and to be able to differentiate from chronic infarcts seen on the CT brain. Further stroke workup with echocardiogram, will be placed on telemetry to rule out A. fib, and vascular imaging with CTA of the head and neck. Physical therapy for gait and balance training. Neurology service will continue to follow up. Please call for any further questions.

## 2015-02-02 NOTE — ED Notes (Addendum)
Attempted to call report, receiving nurse states she is unsure if placement appropriate. Requested that she address quickly.

## 2015-02-02 NOTE — ED Provider Notes (Signed)
CSN: [id number]     Arrival date & time 02/02/15  1325 History   First MD Initiated Contact with Patient 02/02/15 1333     Chief Complaint  Patient presents with  . Extremity Weakness     (Consider location/radiation/quality/duration/timing/severity/associated sxs/prior Treatment) HPI   Alicia Mcintyre is a 70 y.o F with a pmhx of CVA, DM, who presents to the ED today c/o LLE weakness. Pt states that woke up this mornig around 7am and was unable to move her left leg. Pt states "it felt like it was stuck to the floor." Patient states that eventually she was able to get up and walk around but felt like she was off balance. Patient's husband felt that her speech was more slurred than normal so she presented to her primary care office. Her PCP sent her to the emergency room for further evaluation and stroke rule out. Patient's last known normal was 10:30 PM last night. Patient does have residual left-sided weakness from previous strokes. While in the ED, patient states that she feels at her baseline. Denies chest pain, shortness of breath, headache, blurry vision, paresthesias, numbness, increased weakness.  Past Medical History  Diagnosis Date  . Diabetes mellitus without complication (HCC)   . Headache   . Hypertension   . Stroke (HCC)   . Vision abnormalities    Past Surgical History  Procedure Laterality Date  . Partial hysterectomy      has one ovary left/fim  . Appendectomy     Family History  Problem Relation Age of Onset  . Hypertension Mother   . Stroke Mother   . Lupus Mother   . Hypertension Father   . Hypertension Sister   . Sleep apnea Sister   . Stroke Sister   . Diabetes Brother   . Hypertension Brother   . Healthy Brother   . Diabetes Brother   . Healthy Brother   . Healthy Brother   . Diabetes Sister   . Healthy Sister   . Healthy Sister   . Sleep apnea Sister   . Hypertension Sister    Social History  Substance Use Topics  . Smoking status: Never Smoker    . Smokeless tobacco: None  . Alcohol Use: No   OB History    No data available     Review of Systems  All other systems reviewed and are negative.     Allergies  Lipitor; Zocor; Ace inhibitors; Diovan; Losartan potassium; Metformin and related; Ranitidine hcl; and Actos  Home Medications   Prior to Admission medications   Medication Sig Start Date End Date Taking? Authorizing Provider  aspirin 81 MG chewable tablet Chew 81 mg by mouth every other day.   Yes Historical Provider, MD  canagliflozin (INVOKANA) 100 MG TABS tablet Take 100 mg by mouth daily.   Yes Historical Provider, MD  cholecalciferol (VITAMIN D) 1000 UNITS tablet Take 5,000 Units by mouth daily.   Yes Historical Provider, MD  clopidogrel (PLAVIX) 75 MG tablet Take 1 tablet (75 mg total) by mouth daily. 11/01/14  Yes Asa Lente, MD  ferrous sulfate 325 (65 FE) MG tablet Take 325 mg by mouth daily with lunch.   Yes Historical Provider, MD  labetalol (NORMODYNE) 100 MG tablet Take 50 mg by mouth 2 (two) times daily.  10/04/14  Yes Historical Provider, MD  Omega-3 Fatty Acids (FISH OIL) 1000 MG CAPS Take 1,000 mg by mouth 2 (two) times daily. Taking 2 capsules twice daily   Yes Historical  Provider, MD  ONE TOUCH ULTRA TEST test strip  10/06/14  Yes Historical Provider, MD  sulfamethoxazole-trimethoprim (BACTRIM DS,SEPTRA DS) 800-160 MG tablet Take 1 tablet by mouth 2 (two) times daily. Filled on 01-31-15 for 10 days   Yes Historical Provider, MD  TRADJENTA 5 MG TABS tablet Take 5 mg by mouth daily.  10/05/14  Yes Historical Provider, MD  traMADol (ULTRAM) 50 MG tablet Take 1 tablet (50 mg total) by mouth every 6 (six) hours as needed. 11/01/14  Yes Asa Lente, MD  vitamin B-12 (CYANOCOBALAMIN) 1000 MCG tablet Take 1,000 mcg by mouth daily.   Yes Historical Provider, MD   BP 145/74 mmHg  Pulse 73  Temp(Src) 99.2 F (37.3 C) (Oral)  Resp 22  Ht  (1.575 m)  Wt 91.627 kg  BMI 36.94 kg/m2  SpO2  99% Physical Exam  Constitutional: She is oriented to person, place, and time. She appears well-developed and well-nourished. No distress.  HENT:  Head: Normocephalic and atraumatic.  Mouth/Throat: No oropharyngeal exudate.  Eyes: Conjunctivae and EOM are normal. Pupils are equal, round, and reactive to light. Right eye exhibits no discharge. Left eye exhibits no discharge. No scleral icterus.  Cardiovascular: Normal rate, regular rhythm, normal heart sounds and intact distal pulses.  Exam reveals no gallop and no friction rub.   No murmur heard. Pulmonary/Chest: Effort normal and breath sounds normal. No respiratory distress. She has no wheezes. She has no rales. She exhibits no tenderness.  Abdominal: Soft. She exhibits no distension. There is no tenderness. There is no guarding.  Musculoskeletal: Normal range of motion. She exhibits no edema.  Neurological: She is alert and oriented to person, place, and time. She displays normal reflexes. No cranial nerve deficit.  Strength 4 out of 5 in left upper and left lower extremities. Strength 5 out of 5 in the right upper and right lower extremities. Mild decreased sensation in left lower extremity. Normal finger to nose. No pronator drift. No facial droop. No slurred speech. Cranial nerves III through XII grossly intact.  Skin: Skin is warm and dry. No rash noted. She is not diaphoretic. No erythema. No pallor.  Psychiatric: She has a normal mood and affect. Her behavior is normal.  Nursing note and vitals reviewed.   ED Course  Procedures (including critical care time) Labs Review Labs Reviewed  CBC WITH DIFFERENTIAL/PLATELET - Abnormal; Notable for the following:    RBC 5.18 (*)    Hemoglobin 11.3 (*)    HCT 35.7 (*)    MCV 68.9 (*)    MCH 21.8 (*)    RDW 15.9 (*)    Lymphs Abs 0.6 (*)    All other components within normal limits  COMPREHENSIVE METABOLIC PANEL - Abnormal; Notable for the following:    Glucose, Bld 147 (*)     Creatinine, Ser 1.28 (*)    Albumin 3.4 (*)    GFR calc non Af Amer 42 (*)    GFR calc Af Amer 49 (*)    All other components within normal limits  ETHANOL  PROTIME-INR  APTT  URINALYSIS, ROUTINE W REFLEX MICROSCOPIC (NOT AT [facility])  URINE RAPID DRUG SCREEN, HOSP PERFORMED  I-STAT TROPOININ, ED    Imaging Review Ct Head Wo Contrast  02/02/2015  CLINICAL DATA:  Left leg weakness since this morning. Previous stroke in 2012 with left-sided weakness. EXAM: CT HEAD WITHOUT CONTRAST TECHNIQUE: Contiguous axial images were obtained from the base of the skull through the vertex without intravenous  contrast. COMPARISON:  None. FINDINGS: Old right parieto-occipital infarct. Smaller old infarct in a similar location on the left, medially. Old bilateral cerebellar hemisphere infarcts. Small, old right frontal infarct. Small, old or subacute left frontoparietal infarct. Patchy white matter low density in both cerebral hemispheres. Normal size and position of the ventricles with the exception of ex vacuo enlargement of the occipital horn of the right lateral ventricle. No intracranial hemorrhage or mass effect. Unremarkable bones. Mild bilateral maxillary sinus mucosal thickening. IMPRESSION: 1. Small, old or subacute left frontoparietal infarct and old bilateral cerebral and cerebellar infarcts with no acute infarct or intracranial hemorrhage identified. 2. Mild to moderate chronic small vessel white matter ischemic changes in both cerebral hemispheres. 3. Mild chronic bilateral maxillary sinusitis. Electronically Signed   By: Beckie Salts M.D.   On: 02/02/2015 14:59   I have personally reviewed and evaluated these images and lab results as part of my medical decision-making.   EKG Interpretation   Date/Time:  Wednesday February 02 2015 13:34:57 EST Ventricular Rate:  75 PR Interval:  157 QRS Duration: 84 QT Interval:  402 QTC Calculation: 449 R Axis:   42 Text Interpretation:  Sinus rhythm nonspecific  inferior ST changes.  Confirmed by Rubin Payor  MD, Harrold Donath [id number]) on 02/02/2015 2:11:07 PM      MDM   Final diagnoses:  Left-sided weakness   70 year old female with past medical history of CVA and residual left-sided weakness presents to the emergency department today complaining of worsening left sided weakness. Patient states that she woke up this morning and was unable to move her left leg. Within the hour patient was able to get up and walk around but states that she felt "off balance". Patient's last known normal was 10:30 PM last night. Patient's husband was concerned that her speech was more slurred than normal so brought her to the ED for further evaluation. In the ED patient's speech does not seem to be slurred. However, there are neurodeficits on the left side including weakness in the left upper and left lower extremity as well as decreased sensation. DTRs normal.  Difficult to determine if this is patient's baseline residual weakness or worsened. Per patient's husband these deficits are new. No cranial nerve deficit.Patient is alert and oriented and able to answer all questions appropriately. Patient is taking Plavix and aspirin for anticoagulation. We'll continue TIA versus CVA workup including head CT.  CT reveals small, old or subacute left frontoparietal infarct and old bilateral cerebral and cerebellar infarcts with no acute infarct or intracranial hemorrhage identified. EKG, troponin and all the lab work within normal limits. We will speak with hospitalist team for admission for stroke workup. MRI brain ordered. Neurology consultated as well, they will see patient on the floor.  Patient was discussed with and seen by Dr. Rubin Payor who agrees with the treatment plan.       Lester Kinsman Britton, PA-C 02/03/15 0940  Benjiman Core, MD 02/03/15 [phone]

## 2015-02-03 ENCOUNTER — Inpatient Hospital Stay (HOSPITAL_COMMUNITY): Payer: Medicare HMO

## 2015-02-03 ENCOUNTER — Encounter (HOSPITAL_COMMUNITY): Payer: Self-pay | Admitting: *Deleted

## 2015-02-03 DIAGNOSIS — I6789 Other cerebrovascular disease: Secondary | ICD-10-CM

## 2015-02-03 DIAGNOSIS — I6330 Cerebral infarction due to thrombosis of unspecified cerebral artery: Secondary | ICD-10-CM

## 2015-02-03 LAB — URINALYSIS, ROUTINE W REFLEX MICROSCOPIC
Bilirubin Urine: NEGATIVE
HGB URINE DIPSTICK: NEGATIVE
KETONES UR: NEGATIVE mg/dL
LEUKOCYTES UA: NEGATIVE
Nitrite: NEGATIVE
PH: 5.5 (ref 5.0–8.0)
PROTEIN: NEGATIVE mg/dL
Specific Gravity, Urine: 1.035 — ABNORMAL HIGH (ref 1.005–1.030)

## 2015-02-03 LAB — GLUCOSE, CAPILLARY
GLUCOSE-CAPILLARY: 116 mg/dL — AB (ref 65–99)
GLUCOSE-CAPILLARY: 131 mg/dL — AB (ref 65–99)
GLUCOSE-CAPILLARY: 104 mg/dL — AB (ref 65–99)
Glucose-Capillary: 135 mg/dL — ABNORMAL HIGH (ref 65–99)

## 2015-02-03 LAB — URINE MICROSCOPIC-ADD ON

## 2015-02-03 LAB — RAPID URINE DRUG SCREEN, HOSP PERFORMED
AMPHETAMINES: NOT DETECTED
BARBITURATES: NOT DETECTED
BENZODIAZEPINES: NOT DETECTED
Cocaine: NOT DETECTED
Opiates: NOT DETECTED
Tetrahydrocannabinol: NOT DETECTED

## 2015-02-03 LAB — IRON AND TIBC
Iron: 31 ug/dL (ref 28–170)
SATURATION RATIOS: 10 % — AB (ref 10.4–31.8)
TIBC: 308 ug/dL (ref 250–450)
UIBC: 277 ug/dL

## 2015-02-03 LAB — CBC
HCT: 38.7 % (ref 36.0–46.0)
Hemoglobin: 12.8 g/dL (ref 12.0–15.0)
MCH: 22.4 pg — AB (ref 26.0–34.0)
MCHC: 33.1 g/dL (ref 30.0–36.0)
MCV: 67.8 fL — AB (ref 78.0–100.0)
PLATELETS: 186 10*3/uL (ref 150–400)
RBC: 5.71 MIL/uL — ABNORMAL HIGH (ref 3.87–5.11)
RDW: 15.9 % — AB (ref 11.5–15.5)
WBC: 4.7 10*3/uL (ref 4.0–10.5)

## 2015-02-03 LAB — LIPID PANEL
CHOL/HDL RATIO: 3.4 ratio
CHOLESTEROL: 211 mg/dL — AB (ref 0–200)
Cholesterol: 218 mg/dL — ABNORMAL HIGH (ref 0–200)
HDL: 62 mg/dL (ref >40)
HDL: 62 mg/dL (ref >40)
LDL CALC: 137 mg/dL — AB (ref 0–99)
LDL Cholesterol: 130 mg/dL — ABNORMAL HIGH (ref 0–99)
Total CHOL/HDL Ratio: 3.5 RATIO
Triglycerides: 94 mg/dL (ref <150)
Triglycerides: 96 mg/dL (ref <150)
VLDL: 19 mg/dL (ref 0–40)
VLDL: 19 mg/dL (ref 0–40)

## 2015-02-03 LAB — BASIC METABOLIC PANEL
Anion gap: 10 (ref 5–15)
BUN: 15 mg/dL (ref 6–20)
CALCIUM: 9.5 mg/dL (ref 8.9–10.3)
CHLORIDE: 104 mmol/L (ref 101–111)
CO2: 24 mmol/L (ref 22–32)
CREATININE: 1.15 mg/dL — AB (ref 0.44–1.00)
GFR calc Af Amer: 56 mL/min — ABNORMAL LOW (ref >60)
GFR calc non Af Amer: 48 mL/min — ABNORMAL LOW (ref >60)
Glucose, Bld: 129 mg/dL — ABNORMAL HIGH (ref 65–99)
Potassium: 4.1 mmol/L (ref 3.5–5.1)
SODIUM: 138 mmol/L (ref 135–145)

## 2015-02-03 LAB — TROPONIN I: Troponin I: <0.03 ng/mL (ref <0.031)

## 2015-02-03 LAB — CREATININE, URINE, RANDOM: CREATININE, URINE: 92.3 mg/dL

## 2015-02-03 LAB — TSH: TSH: 0.674 u[IU]/mL (ref 0.350–4.500)

## 2015-02-03 LAB — SODIUM, URINE, RANDOM: SODIUM UR: 109 mmol/L

## 2015-02-03 LAB — FERRITIN: FERRITIN: 186 ng/mL (ref 11–307)

## 2015-02-03 MED ORDER — CANAGLIFLOZIN 100 MG PO TABS
100.0000 mg | ORAL_TABLET | Freq: Every day | ORAL | Status: DC
  Administered 2015-02-03 – 2015-02-04 (×2): 100 mg via ORAL
  Filled 2015-02-03 (×3): qty 1

## 2015-02-03 MED ORDER — INSULIN ASPART 100 UNIT/ML ~~LOC~~ SOLN
0.0000 [IU] | Freq: Three times a day (TID) | SUBCUTANEOUS | Status: DC
  Administered 2015-02-03 – 2015-02-04 (×4): 1 [IU] via SUBCUTANEOUS

## 2015-02-03 MED ORDER — STROKE: EARLY STAGES OF RECOVERY BOOK
Freq: Once | Status: AC
  Administered 2015-02-03: 1

## 2015-02-03 MED ORDER — SENNOSIDES-DOCUSATE SODIUM 8.6-50 MG PO TABS: 1.0000 | ORAL_TABLET | Freq: Every evening | ORAL | Status: DC | PRN

## 2015-02-03 MED ORDER — ASPIRIN 81 MG PO CHEW: 81.0000 mg | CHEWABLE_TABLET | ORAL | Status: DC

## 2015-02-03 MED ORDER — EZETIMIBE 10 MG PO TABS
10.0000 mg | ORAL_TABLET | Freq: Every day | ORAL | Status: DC
  Administered 2015-02-03 – 2015-02-04 (×2): 10 mg via ORAL
  Filled 2015-02-03 (×2): qty 1

## 2015-02-03 MED ORDER — LINAGLIPTIN 5 MG PO TABS
5.0000 mg | ORAL_TABLET | Freq: Every day | ORAL | Status: DC
  Administered 2015-02-03 – 2015-02-04 (×2): 5 mg via ORAL
  Filled 2015-02-03 (×2): qty 1

## 2015-02-03 MED ORDER — HEPARIN SODIUM (PORCINE) 5000 UNIT/ML IJ SOLN
5000.0000 [IU] | Freq: Three times a day (TID) | INTRAMUSCULAR | Status: DC
  Administered 2015-02-03 – 2015-02-04 (×3): 5000 [IU] via SUBCUTANEOUS
  Filled 2015-02-03 (×4): qty 1

## 2015-02-03 MED ORDER — CLOPIDOGREL BISULFATE 75 MG PO TABS: 75.0000 mg | ORAL_TABLET | Freq: Every day | ORAL | Status: DC

## 2015-02-03 MED ORDER — TRAMADOL HCL 50 MG PO TABS: 50.0000 mg | ORAL_TABLET | Freq: Four times a day (QID) | ORAL | Status: DC | PRN

## 2015-02-03 NOTE — Evaluation (Signed)
Physical Therapy Evaluation Patient Details Name: Alicia Mcintyre MRN: [id number] DOB: May 25, 1945 Today's Date: 02/03/2015   History of Present Illness  Alicia Mcintyre is an 70 y.o. 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 When she woke up and she noticed her left leg had poor coordination and was not doing what she wanted to do her has been also noticed worsening slurred speech.  Clinical Impression  Pt admitted with above diagnosis. Pt currently with functional limitations due to the deficits listed below (see PT Problem List).  Pt will benefit from skilled PT to increase their independence and safety with mobility to allow discharge to the venue listed below.  Pt moving well overall, with some decreased balance noted with initial standing and with turning.  Pt feels she is doing better than when she came, but does not feel she is ambulating at her baseline level.       Follow Up Recommendations No PT follow up    Equipment Recommendations  None recommended by PT    Recommendations for Other Services       Precautions / Restrictions Precautions Precautions: Fall Restrictions Weight Bearing Restrictions: No      Mobility  Bed Mobility               General bed mobility comments: sitting in recliner upon arrival  Transfers Overall transfer level: Needs assistance   Transfers: Sit to/from Stand Sit to Stand: Min guard         General transfer comment: slight unsteadiness upon standing, but able to steady self without A  Ambulation/Gait Ambulation/Gait assistance: Min guard;Supervision Ambulation Distance (Feet): 300 Feet Assistive device: None;1 person hand held assist       General Gait Details: Amb for a portion with HHA, then progressed to no AD with min/guard and eventually to S.  Pt with hx of decreased vision on L, but was able to scan environment and look at room numbers to locate her room.  Stairs            Wheelchair Mobility    Modified Rankin (Stroke Patients Only) Modified Rankin (Stroke Patients Only) Pre-Morbid Rankin Score: Slight disability Modified Rankin: Slight disability     Balance Overall balance assessment: Needs assistance         Standing balance support: No upper extremity supported Standing balance-Leahy Scale: Fair                 High Level Balance Comments: requires increased time with turning for safety             Pertinent Vitals/Pain Pain Assessment: Faces Faces Pain Scale: Hurts little more Pain Location: L shoulder pain    Home Living Family/patient expects to be discharged to:: Private residence Living Arrangements: Spouse/significant other Available Help at Discharge: Available PRN/intermittently;Family Type of Home: House Home Access: Stairs to enter Entrance Stairs-Rails: Right Entrance Stairs-Number of Steps: 2 Home Layout: One level Home Equipment: Cane - single point;Bedside commode      Prior Function Level of Independence: Independent;Independent with assistive device(s)         Comments: Ambulates with cane at times in the community. Has A with the stairs.     Hand Dominance   Dominant Hand: Right    Extremity/Trunk Assessment   Upper Extremity Assessment: Defer to OT evaluation (c/o L shoulder pain with some  ROM)           Lower Extremity Assessment: Overall WFL for tasks assessed         Communication      Cognition Arousal/Alertness: Awake/alert Behavior During Therapy: WFL for tasks assessed/performed Overall Cognitive Status: Within Functional Limits for tasks assessed                      General Comments      Exercises        Assessment/Plan    PT Assessment Patient needs continued PT services  PT Diagnosis Difficulty walking   PT  Problem List Decreased balance;Decreased mobility  PT Treatment Interventions Gait training;Functional mobility training;Therapeutic activities;Therapeutic exercise;Neuromuscular re-education;Balance training;Stair training   PT Goals (Current goals can be found in the Care Plan section) Acute Rehab PT Goals Patient Stated Goal: Go home PT Goal Formulation: With patient Time For Goal Achievement: 02/10/15 Potential to Achieve Goals: Good    Frequency Min 4X/week   Barriers to discharge        Co-evaluation               End of Session Equipment Utilized During Treatment: Gait belt Activity Tolerance: Patient tolerated treatment well Patient left: in chair;with call bell/phone within reach;with nursing/sitter in room Nurse Communication: Mobility status         Time: 1201-1221 PT Time Calculation (min) (ACUTE ONLY): 20 min   Charges:   PT Evaluation $PT Eval Moderate Complexity: 1 Procedure     PT G Codes:        Cuahutemoc Attar LUBECK 02/03/2015, 1:20 PM

## 2015-02-03 NOTE — Progress Notes (Signed)
Family Medicine Teaching Service Daily Progress Note Intern Pager: [phone]  Patient name: Alicia Mcintyre Medical record number: [id number] Date of birth: 06/06/1945 Age: 70 y.o. Gender: female  Primary Care Provider: No primary care provider on file. Consultants: Neurology Code Status: DNR/DNI  Pt Overview and Major Events to Date:  1/25: Admitted to FMTS for stroke  Assessment and Plan: Alicia Mcintyre is a 70 y.o. female presenting with left lower extremity weakness and dysarthria that has resolved, concerning for TIA. PMH is significant for HTN, T2DM, Hx CVA in 2012 and 2015 with residual left sided weakness.  Acute ischemic stroke of right paramedian pons: Her exam is unchanged from yesterday, with 4/5 strength in the UE and LE, but her dysarthria has much improved today. MRI brain showing patchy restricted diffusion in the the right paramedian pons, consistent with acute ischemic infarct, multiple remote infarcts in the cerebral hemispheres and cerebellar hemispheres bilaterally. CTA head and neck showed multi focal severe stenoses in the proximal P2 segments R > L.  - Continue ASA  daily and Plavix  daily - Neurology consulted, appreciate recommendations - ECHO - Risk stratification labs: HgbA1c pending, lipid panel (Chol 218, TG 96, HDL 62, LDL 137), TSH nl - Pt has shortness of breath with statin use. Will start Ezetimibe  daily. - Passed bedside swallow - PT/OT/SLP - Cardiac monitoring with continuous pulse ox - Neuro checks q4hrs  Microcytic Anemia: Takes ferrous sulfate  daily at home. Hgb 11.3, MCV 68.9. 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 Iron 31, TIBC 308, Ferritin 186.  - Daily CBCs - Will stop ferrous sulfate.  HTN: Takes Labetalol  PO bid at home. BP up to 152/54 overnight. - Holding home BP meds in the setting of permissive hypertension  T2DM: No A1c on file. CBG was 116 this am, 94 last night. - Continue home meds: Invokana  daily and Tradjenta   daily - Sensitive SSI - CBGs with meals and at bedtime - Hemoglobin A1c   Possible acute on chronic kidney disease: Cr 1.28, GFR 49. No previous values to compare this to. - UA, urine creatinine, urine sodium pending.  - Will calculate FENa  FEN/GI: Heart healthy diet, SLIV Prophylaxis: Heparin sq  Disposition: Pending PT/OT recs  Subjective:  Pt states she feels better this morning. She feels like her left leg is stronger. Her husband thinks her speech is more clear today. She denies any headaches, blurry vision, or dizziness.  Objective: Temp:  [97.8 F (36.6 C)-99.2 F (37.3 C)] 97.8 F (36.6 C) (01/26 0656) Pulse Rate:  [59-74] 61 (01/26 0656) Resp:  [14-23] 20 (01/26 0656) BP: (127-155)/(48-98) 130/66 mmHg (01/26 0656) SpO2:  [96 %-100 %] 97 % (01/26 0656) Weight:  [202 lb (91.627 kg)] 202 lb (91.627 kg) (01/25 2251) Physical Exam: General: Well-appearing female, in NAD, talktative Eyes: EOMI, PERRLA ENTM: Oropharynx clear, MMM Neck: Supple Cardiovascular: RRR, no murmurs Respiratory: CTAB, normal work of breathing Abdomen: +BS, soft, non-tender, non-distended MSK: Warm and well-perfused, no edema Skin: No rashes or lesions Neuro: Awake, alert, CN 2-12 intact, 4/5 muscle strength in the left upper and lower extremity, 5/5 muscle strength in the right upper and lower extremity, finger-to-nose normal on the right and uncoordinated on the left, dysarthria improved from yesterday. Psych: Appropriate affect, normal behavior  Laboratory:  Recent Labs Lab 02/02/15 1350  WBC 6.4  HGB 11.3*  HCT 35.7*  PLT 157    Recent Labs Lab 02/02/15 1350  NA 140  K 4.5  CL 108  CO2 23  BUN 14  CREATININE 1.28*  CALCIUM 9.6  PROT 7.4  BILITOT 0.5  ALKPHOS 57  ALT 16  AST 16  GLUCOSE 147*   Lipid panel: Chol 218, TG 96, HDL 62, LDL 137 TSH: 0.674 Troponin <0.03 Iron panel: Iron 31, TIBC 308, Ferritin 186   Imaging/Diagnostic Tests: - CT head: small,  old/subacute left frontoparietal infarct and old bilateral cerebral and cerebellar infarcts with no acute infarct or intracranial hemorrhage - MRI brain: Patchy restricted diffusion within the right paramedian pons, consistent with acute ischemic nonhemorrhagic infarct. Multiple additional remote infarcts involving both cerebral hemispheres and cerebellar hemispheres bilaterally - CTA head/neck: Negative CTA neck. Multi focal severe stenosis within the proximal P2 segments bilaterally R > L. Focal moderate right M2 branch stenosis without occlusion. 2mm focal outpouching extending from the posterior aspect of the right supraclioid ICA, possible small aneurysm could have this appearance.  Campbell Stall, MD 02/03/2015, 7:59 AM PGY-1, [facility] Family Medicine FPTS Intern pager: [phone], text pages welcome

## 2015-02-03 NOTE — Discharge Summary (Signed)
Family Medicine Teaching [facility] Discharge Summary  Patient name: Alicia Mcintyre Medical record number: [id number] Date of birth: Feb 13, 1945 Age: 70 y.o. Gender: female Date of Admission: 02/02/2015  Date of Discharge: 02/04/15 Admitting Physician: Doreene Eland, MD  Primary Care Provider: No primary care provider on file. Consultants: Neurology  Indication for Hospitalization: Leg leg weakness and dysarthria  Discharge Diagnoses/Problem List:  CVA w/ residual left-sided weakness and dysarthria T2DM HTN OSA  Disposition: Home   Discharge Condition: Stable, improved  Discharge Exam:  General: Well-appearing female, in NAD, talkative Eyes: EOMI, PERRLA ENTM: Oropharynx clear, MMM Neck: Supple Cardiovascular: RRR, no murmurs Respiratory: CTAB, normal work of breathing Abdomen: +BS, soft, non-tender, non-distended MSK: Warm and well-perfused, no edema Skin: No rashes or lesions Neuro: Awake, alert, CN 2-12 intact, 4/5 muscle strength in the left upper and lower extremity, 5/5 muscle strength in the right upper and lower extremity, dysarthria improved from yesterday. Psych: Appropriate affect, normal behavior  Brief Hospital Course:  Alicia Mcintyre is a 70 year old female who presented to the ED with left lower extremity weakness and dysarthria, both of which improved while she was in the ED. CT head showed a small old/subacute left frontoparietal infarct and old bilateral cerebral and cerebellar infarcts with no new infarct or intracranial hemorrhage. MRI showed acute ischemic infarct in the right paramedian pons. CTA head and neck showed multifocal severe stenoses. Neurology was consulted and recommended a usual stroke work-up. They also recommended that she take Plavix alone, so we discontinued her aspirin. Her lipid panel was significant for an LDL of 137, so Ezetimbe  daily was started, given Pt's allergy to statins. HgbA1c was 8.14% and TSH was normal. ECHO showed EF  60-65% and G1DD. PT was consulted and recommended no additional PT follow-up. OT recommended outpatient OT. SLP did not recommend any additional follow-up. She was discharged home with follow-up with her Neurologist.  Pt was noted to have a microcytic anemia during her hospitalization. She takes ferrous sulfate at home. Her iron and ferritin levels were normal. We thought she likely has a minor thalassemia, given her high RBC, low MCV, and low Hgb on CBC. We discontinued her ferrous sulfate.  For her hypertension, she took Labetalol  PO bid prior to admission. We switched her to Metoprolol succinate  daily, given her G1DD. She is allergic to ACEIs and ARBs.  Issues for Follow Up:  1. We got an iron panel that showed normal iron, normal ferritin, and high RBC, which is consistent with Minor Thalassemia. We discontinued her ferrous sulfate. 2. Pt was discharged home on just Plavix per Neuro recommendations. We discontinued her Aspirin.  Significant Procedures: None  Significant Labs and Imaging:   Recent Labs Lab 02/02/15 1350 02/03/15 1258 02/04/15 0535  WBC 6.4 4.7 4.7  HGB 11.3* 12.8 12.3  HCT 35.7* 38.7 37.8  PLT 157 186 183    Recent Labs Lab 02/02/15 1350 02/03/15 1258 02/04/15 0535  NA 140 138 137  K 4.5 4.1 4.1  CL 108 104 105  CO2 GLUCOSE 147* 129* 119*  BUN CREATININE 1.28* 1.15* 1.04*  CALCIUM 9.6 9.5 9.4  ALKPHOS 57  --   --   AST 16  --   --   ALT 16  --   --   ALBUMIN 3.4*  --   --    Lipid panel: Chol 218, TG 96, HDL 62, LDL 137 TSH: 0.674 Troponin <0.03 Iron panel:  Iron 31, TIBC 308, Ferritin 186  - CT head: small, old/subacute left frontoparietal infarct and old bilateral cerebral and cerebellar infarcts with no acute infarct or intracranial hemorrhage - MRI brain: Patchy restricted diffusion within the right paramedian pons, consistent with acute ischemic nonhemorrhagic infarct. Multiple additional remote infarcts involving  both cerebral hemispheres and cerebellar hemispheres bilaterally - CTA head/neck: Negative CTA neck. Multi focal severe stenosis within the proximal P2 segments bilaterally R > L. Focal moderate right M2 branch stenosis without occlusion. 2mm focal outpouching extending from the posterior aspect of the right supraclioid ICA, possible small aneurysm could have this appearance.  Results/Tests Pending at Time of Discharge: None  Discharge Medications:    Medication List    STOP taking these medications        aspirin 81 MG chewable tablet     ferrous sulfate 325 (65 FE) MG tablet     labetalol 100 MG tablet  Commonly known as:  NORMODYNE     sulfamethoxazole-trimethoprim 800-160 MG tablet  Commonly known as:  BACTRIM DS,SEPTRA DS      TAKE these medications        cholecalciferol 1000 units tablet  Commonly known as:  VITAMIN D  Take 5,000 Units by mouth daily.     clopidogrel 75 MG tablet  Commonly known as:  PLAVIX  Take 1 tablet (75 mg total) by mouth daily.     ezetimibe 10 MG tablet  Commonly known as:  ZETIA  Take 1 tablet (10 mg total) by mouth daily.     Fish Oil 1000 MG Caps  Take 1,000 mg by mouth 2 (two) times daily. Taking 2 capsules twice daily     INVOKANA 100 MG Tabs tablet  Generic drug:  canagliflozin  Take 100 mg by mouth daily.     metoprolol succinate 25 MG 24 hr tablet  Commonly known as:  TOPROL XL  Take 1 tablet (25 mg total) by mouth daily.     ONE TOUCH ULTRA TEST test strip  Generic drug:  glucose blood     TRADJENTA 5 MG Tabs tablet  Generic drug:  linagliptin  Take 5 mg by mouth daily.     traMADol 50 MG tablet  Commonly known as:  ULTRAM  Take 1 tablet (50 mg total) by mouth every 6 (six) hours as needed.     vitamin B-12 1000 MCG tablet  Commonly known as:  CYANOCOBALAMIN  Take 1,000 mcg by mouth daily.        Discharge Instructions: Please refer to Patient Instructions section of EMR for full details.  Patient was counseled  important signs and symptoms that should prompt return to medical care, changes in medications, dietary instructions, activity restrictions, and follow up appointments.   Follow-Up Appointments: Follow-up Information    Follow up with SATER,RICHARD A, MD In 2 months.   Specialty:  Neurology   Why:  Stroke Clinic, Office will call you with appointment date & time   Contact information:   [address] [place] [address] [phone]       Follow up with [facility].   Specialty:  Rehabilitation   Why:  They will call you to arrange the appointment time and date. If you have not heard from Neurorehab in a week please call the number provided.    Contact information:   [address] [id number] mc [place] [address] [phone]      Campbell Stall, MD 02/05/2015, 10:11 PM  PGY-1, [facility] Family Medicine

## 2015-02-03 NOTE — Care Management Note (Signed)
Case Management Note  Patient Details  Name: Alicia Mcintyre MRN: [id number] Date of Birth: 09-25-1945  Subjective/Objective:                    Action/Plan: Patient was admitted with CVA. Lives at home with spouse. Will follow for discharge needs pending PT/OT evals and physician orders.  Expected Discharge Date:                  Expected Discharge Plan:     In-House Referral:     Discharge planning Services     Post Acute Care Choice:    Choice offered to:     DME Arranged:    DME Agency:     HH Arranged:    HH Agency:     Status of Service:  In process, will continue to follow  Medicare Important Message Given:    Date Medicare IM Given:    Medicare IM give by:    Date Additional Medicare IM Given:    Additional Medicare Important Message give by:     If discussed at Long Length of Stay Meetings, dates discussed:    Additional Comments:  Anda Kraft, RN 02/03/2015, 10:47 AM [phone]

## 2015-02-03 NOTE — Evaluation (Signed)
Speech Language Pathology Evaluation Patient Details Name: Alicia Mcintyre MRN: [id number] DOB: 1945/08/31 Today's Date: 02/03/2015 Time: 1415-1430 SLP Time Calculation (min) (ACUTE ONLY): 15 min  Problem List:  Patient Active Problem List   Diagnosis Date Noted  . Cerebral thrombosis with cerebral infarction 02/03/2015  . Weakness 02/02/2015  . Left-sided weakness 02/02/2015  . Dysarthria   . Type 2 diabetes mellitus with complication (HCC)   . Essential hypertension   . Diabetes mellitus without complication (HCC) 11/01/2014  . Benign hypertension 11/01/2014  . OSA (obstructive sleep apnea) 11/01/2014  . History of CVA (cerebrovascular accident) 11/01/2014  . Gait disturbance 11/01/2014  . Pain of left thumb 11/01/2014   Past Medical History:  Past Medical History  Diagnosis Date  . Diabetes mellitus without complication (HCC)   . Headache   . Hypertension   . Stroke (HCC)   . Vision abnormalities    Past Surgical History:  Past Surgical History  Procedure Laterality Date  . Partial hysterectomy      has one ovary left/fim  . Appendectomy    . Cataract extraction     HPI:  70 y.o. 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  Admitted 1/25 with worsening of left-sided symptoms of poor coordination and slurred speech. Dx with right paramedian pontine infarct.    Assessment / Plan / Recommendation Clinical Impression  Pt presents with improved speech intelligibility since yesterday; pt and her spouse feel she is close to baseline.  Speech with only intermittent, mild deficits in clarity.  Cognition and language are [facility].  No SLP services are required at D/C.  Pt agrees.  We will sign off.     SLP Assessment  Patient does not need any further Speech Lanaguage Pathology Services              SLP Evaluation Prior Functioning  Cognitive/Linguistic Baseline: Within functional limits Type of Home: House  Lives With: Spouse Available Help at Discharge: Available PRN/intermittently;Family   Cognition  Overall Cognitive Status: Within Functional Limits for tasks assessed Orientation Level: Oriented X4    Comprehension  Auditory Comprehension Overall Auditory Comprehension: Appears within functional limits for tasks assessed Visual Recognition/Discrimination Discrimination: Within Function Limits Reading Comprehension Reading Status: Within funtional limits    Expression Expression Primary Mode of Expression: Verbal Verbal Expression Overall Verbal Expression: Appears within functional limits for tasks assessed Written Expression Dominant Hand: Right   Oral / Motor  Oral Motor/Sensory Function Overall Oral Motor/Sensory Function: Within functional limits Motor Speech Overall Motor Speech: Impaired at baseline   GO                    Blenda Mounts Laurice 02/03/2015, 2:33 PM

## 2015-02-03 NOTE — Progress Notes (Signed)
Echocardiogram 2D Echocardiogram has been performed.  Dorothey Baseman 02/03/2015, 2:24 PM

## 2015-02-03 NOTE — Progress Notes (Signed)
STROKE TEAM PROGRESS NOTE   HISTORY OF PRESENT ILLNESS Alicia Mcintyre is an 70 y.o. 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 When she woke up this morning at 7 AM, she noticed her left leg had poor coordination and was not doing what she wanted to do her has been also noticed worsening slurred speech. She was LKW 02/01/2015 at 1030p. She was brought into the ER for further evaluation. Her husband feels that the left-sided symptoms of poor coordination and slurred speech are worse than her baseline. She is on aspirin and Plavix at home. hasn't been diagnosed with atrial fibrillation per history. reports good compliance with the medications. Patient was not administered TPA secondary to being outside of the IV TPA time window. She was admitted for further evaluation and treatment.   SUBJECTIVE (INTERVAL HISTORY) No family is at the bedside.  Overall she feels her condition is stable. She cannot see to the far left and complains about that (old from previous stroke) per pt.    OBJECTIVE Temp:  [97.5 F (36.4 C)-99.3 F (37.4 C)] 97.9 F (36.6 C) (01/26 1103) Pulse Rate:  [59-74] 60 (01/26 1103) Cardiac Rhythm:  [-] Normal sinus rhythm (01/26 0743) Resp:  [14-23] 18 (01/26 1103) BP: (127-155)/(48-98) 139/50 mmHg (01/26 1103) SpO2:  [96 %-100 %] 96 % (01/26 1103) Weight:  [91.627 kg (202 lb)] 91.627 kg (202 lb) (01/25 2251)  CBC:   Recent Labs Lab 02/02/15 1350  WBC 6.4  NEUTROABS 5.0  HGB 11.3*  HCT 35.7*  MCV 68.9*  PLT 157    Basic Metabolic Panel:   Recent Labs Lab 02/02/15 1350  NA 140  K 4.5  CL 108  CO2 23  GLUCOSE 147*  BUN 14  CREATININE 1.28*  CALCIUM 9.6    Lipid Panel:     Component Value Date/Time   CHOL 218* 02/03/2015 0148   CHOL 211* 02/03/2015 0148   TRIG 96 02/03/2015  0148   TRIG 94 02/03/2015 0148   HDL 62 02/03/2015 0148   HDL 62 02/03/2015 0148   CHOLHDL 3.5 02/03/2015 0148   CHOLHDL 3.4 02/03/2015 0148   VLDL 19 02/03/2015 0148   VLDL 19 02/03/2015 0148   LDLCALC 137* 02/03/2015 0148   LDLCALC 130* 02/03/2015 0148   HgbA1c: No results found for: HGBA1C Urine Drug Screen: No results found for: LABOPIA, COCAINSCRNUR, LABBENZ, AMPHETMU, THCU, LABBARB    IMAGING  Ct Head Wo Contrast 02/02/2015  1. Small, old or subacute left frontoparietal infarct and old bilateral cerebral and cerebellar infarcts with no acute infarct or intracranial hemorrhage identified. 2. Mild to moderate chronic small vessel white matter ischemic changes in both cerebral hemispheres. 3. Mild chronic bilateral maxillary sinusitis. Electronically Signed     CTA NECK  02/03/2015   1. Negative CTA of the neck. No high-grade or flow limiting stenosis. 2. Minimal atheromatous plaque at the origin of the left vertebral artery without significant stenosis. No other significant atheromatous disease within the major arterial vasculature of the neck.   CTA HEAD  02/03/2015   1. No large or proximal arterial branch occlusion within intracranial circulation. 2. Multi focal severe stenoses within the proximal P2 segments bilaterally, right worse than left. 3. Focal moderate right M2 branch stenosis without occlusion. 4. Mild atheromatous regularity artery without significant stenosis. 5. Scattered atheromatous plaque within the cavernous ICAs bilaterally  with secondary mild diffuse narrowing. 6. 2 mm focal outpouching extending from the posterior aspect of the right supraclinoid ICA. Normal infundibulum is favored, although possible small aneurysm could also have this appearance. Attention at follow-up.   [name] 02/02/2015   1. Patchy restricted diffusion within the right paramedian pons, consistent with acute ischemic nonhemorrhagic infarct. No significant mass effect. 2. Multiple  additional remote infarcts involving both cerebral hemispheres as well as the cerebellar hemispheres bilaterally as above. 3. Mild chronic small vessel ischemic disease.    PHYSICAL EXAM Pleasant elderly African-American lady currently not in distress. . Afebrile. Head is nontraumatic. Neck is supple without bruit.    Cardiac exam no murmur or gallop. Lungs are clear to auscultation. Distal pulses are well felt. Neurological Exam :  Awake alert oriented 3 with normal speech and language function. No aphasia or apraxia dysarthria. Extraocular moments are full range without nystagmus. Fundi were not visualized. Vision acuity seems adequate. Partial left homonymous hemianopsia which is old as per the patient. Moderate left lower facial weakness. Tongue midline. Cough and gag are brisk. Mild left hemiparesis 4/5 strength with significant weakness of left grip and intrinsic hand muscles. Mild weakness of left hip flexor and ankle dorsiflexors. Tone is increased on the left side with mild spasticity. Impaired coordination on the left with mild diminished sensation on the left hemibody. Left plantar equivocal right downgoing. Gait was not tested. ASSESSMENT/PLAN [name] is a 70 y.o. 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 She did not receive IV t-PA due to delay in arrival.   Stroke:  Non-dominant right paramedian pontine infarct secondary to small vessel disease source  MRI  right paramedian pontine infarcts   CTA head  R>L P2 stenosis, scattered plaques B ICA, R supraclinoid inundibulum  CTA neck no large vessel stenosis  Canceled carotid doppler at CTA neck without stenosis  2D Echo  Pending. No need for echo bubble, which was canceled  LDL 130-137  HgbA1c pending  Heparin 5000 units sq tid  for VTE prophylaxis Diet heart healthy/carb modified Room service appropriate?: Yes; Fluid consistency:: Thin  aspirin 81 mg about 3 x a week and clopidogrel 75 mg daily prior to admission, no on aspirin 81 mg daily and clopidogrel 75 mg daily. Do not recommend dual antiplatelets in this patient, but plavix alone. Aspirin discontinued  Patient counseled to be compliant with her antithrombotic medications  Ongoing aggressive stroke risk factor management  Therapy recommendations:  pending   Disposition:  pending   Followed by Dr. Epimenio Foot at [facility] prior to admission, will need to follow up with him after discharge  Hypertension  Stable Permissive hypertension (OK if < 220/120) but gradually normalize in 5-7 days  Hyperlipidemia  Home meds:  No statin  LDL 130-137, goal < 70  Add statin to meet goal and continue statin at discharge  Diabetes  HgbA1c pending , goal < 7.0  Other Stroke Risk Factors  Advanced age  Obesity, Body mass index is 36.94 kg/(m^2).   Hx stroke/TIA - most likely due to atherosclerosis   2012-2015 sizable right PCA chronic infarct, a small left MCA peripheral infarct, also right MCA small multiple infarcts. with residual neurological symptoms from her prior strokes in the form of left hemianopia, mild dysarthria , left mild spastic hemiparesis, gait instability.   Family  hx stroke (mother, sister) obstructive sleep apnea, on CPAP at home  Other Active Problems  microcyttic anemia  Hospital day # 1  [facility] See Amion for Pager information 02/03/2015 11:27 AM  I have personally examined this patient, reviewed notes, independently viewed imaging studies, participated in medical decision making and plan of care. I have made any additions or clarifications directly to the above note. Agree with note above. She presented with a fall and worsening of existing left hemiplegia possibly from new right brain infarct. She remains  at risk for neurological worsening, recurrent stroke, TIA needs ongoing evaluation. I had a long discussion with the patient and her husband at the bedside and answered questions. Check MRI scan of the brain and no need for doing the remaining stroke workup as she had it less than 6 months ago.  Delia Heady, MD Medical Director [facility] Pager: [phone] 02/03/2015 3:40 PM    To contact Stroke Continuity provider, please refer to [email or web address]. After hours, contact General Neurology

## 2015-02-04 LAB — GLUCOSE, CAPILLARY
GLUCOSE-CAPILLARY: 141 mg/dL — AB (ref 65–99)
Glucose-Capillary: 121 mg/dL — ABNORMAL HIGH (ref 65–99)

## 2015-02-04 LAB — BASIC METABOLIC PANEL
Anion gap: 10 (ref 5–15)
BUN: 17 mg/dL (ref 6–20)
CHLORIDE: 105 mmol/L (ref 101–111)
CO2: 22 mmol/L (ref 22–32)
CREATININE: 1.04 mg/dL — AB (ref 0.44–1.00)
Calcium: 9.4 mg/dL (ref 8.9–10.3)
GFR calc Af Amer: >60 mL/min (ref >60)
GFR calc non Af Amer: 54 mL/min — ABNORMAL LOW (ref >60)
Glucose, Bld: 119 mg/dL — ABNORMAL HIGH (ref 65–99)
POTASSIUM: 4.1 mmol/L (ref 3.5–5.1)
Sodium: 137 mmol/L (ref 135–145)

## 2015-02-04 LAB — CBC
HEMATOCRIT: 37.8 % (ref 36.0–46.0)
HEMOGLOBIN: 12.3 g/dL (ref 12.0–15.0)
MCH: 22.2 pg — ABNORMAL LOW (ref 26.0–34.0)
MCHC: 32.5 g/dL (ref 30.0–36.0)
MCV: 68.2 fL — ABNORMAL LOW (ref 78.0–100.0)
Platelets: 183 10*3/uL (ref 150–400)
RBC: 5.54 MIL/uL — AB (ref 3.87–5.11)
RDW: 15.7 % — ABNORMAL HIGH (ref 11.5–15.5)
WBC: 4.7 10*3/uL (ref 4.0–10.5)

## 2015-02-04 LAB — HEMOGLOBIN A1C
HEMOGLOBIN A1C: 8.1 % — AB (ref 4.8–5.6)
MEAN PLASMA GLUCOSE: 186 mg/dL

## 2015-02-04 MED ORDER — EZETIMIBE 10 MG PO TABS: 10.0000 mg | ORAL_TABLET | Freq: Every day | ORAL | Status: AC

## 2015-02-04 MED ORDER — METOPROLOL SUCCINATE ER 25 MG PO TB24: 25.0000 mg | ORAL_TABLET | Freq: Every day | ORAL | Status: DC

## 2015-02-04 NOTE — Progress Notes (Addendum)
Physical Therapy Treatment Patient Details Name: Alicia Mcintyre MRN: [id number] DOB: [birth year] Today's Date: 02/04/2015    History of Present Illness Alicia Mcintyre is an 70 y.o. 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    PT Comments    Pt is hopeful to go home today. She appears to be at baseline for functional mobility.  Follow Up Recommendations  No PT follow up     Equipment Recommendations  None recommended by PT    Recommendations for Other Services       Precautions / Restrictions Precautions Precautions: Fall    Mobility  Bed Mobility Overal bed mobility: Modified Independent                Transfers   Equipment used: None   Sit to Stand: Supervision         General transfer comment: no physical assist  Ambulation/Gait Ambulation/Gait assistance: Supervision Ambulation Distance (Feet): 350 Feet Assistive device: None Gait Pattern/deviations: Step-through pattern;Decreased step length - left Gait velocity: mildly decreased       Stairs Stairs: Yes Stairs assistance: Min guard Stair Management: One rail Right;Forwards;Step to pattern Number of Stairs: 5 General stair comments: min guard for safety only. No physical assist  Wheelchair Mobility    Modified Rankin (Stroke Patients Only) Modified Rankin (Stroke Patients Only) Pre-Morbid Rankin Score: Slight disability Modified Rankin: Slight disability     Balance                                    Cognition Arousal/Alertness: Awake/alert Behavior During Therapy: WFL for tasks assessed/performed Overall Cognitive Status: Within Functional Limits for tasks assessed                      Exercises      General Comments        Pertinent Vitals/Pain Pain Assessment: No/denies  pain    Home Living                      Prior Function            PT Goals (current goals can now be found in the care plan section) Acute Rehab PT Goals Patient Stated Goal: Go home PT Goal Formulation: With patient Time For Goal Achievement: 02/10/15 Potential to Achieve Goals: Good Progress towards PT goals: Progressing toward goals    Frequency  Min 4X/week    PT Plan Current plan remains appropriate    Co-evaluation             End of Session Equipment Utilized During Treatment: Gait belt Activity Tolerance: Patient tolerated treatment well Patient left: in bed;with call bell/phone within reach;with family/visitor present     Time: 3086-5784 PT Time Calculation (min) (ACUTE ONLY): 11 min  Charges:  $Gait Training: 8-22 mins                    G Codes:      Alicia Mcintyre 02/04/2015, 11:58 AM

## 2015-02-04 NOTE — Progress Notes (Addendum)
Occupational Therapy Evaluation Patient Details Name: Alicia Mcintyre MRN: [id number] DOB: 1945-10-19 Today's Date: 02/04/2015    History of Present Illness Alicia Mcintyre is an 70 y.o. 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   Clinical Impression   PTA, pt mod I with mobility and ADL. Pt states she is not at her baseline. Discussed how she has never had any "formal therapy". Both husband and pt interested in following up with OT at the neuro oupt center to establish a HEP for strengthening, coordination and visual scanning secondary to apparent L homonymous hemianopia. Outpt can further assess L shoulder pain.  All further OT can be addressed in the outpt setting. Case manager notified.     Follow Up Recommendations  Supervision - Intermittent;Outpatient OT (neuro outpt OT)    Equipment Recommendations  Tub/shower seat    Recommendations for Other Services       Precautions / Restrictions Precautions Precautions: Fall      Mobility Bed Mobility Overal bed mobility: Modified Independent                Transfers   Equipment used: None   Sit to Stand: Supervision         General transfer comment: no physical assist    Balance Overall balance assessment: Needs assistance           Standing balance-Leahy Scale: Fair                              ADL Overall ADL's : Needs assistance/impaired                                     Functional mobility during ADLs: Supervision/safety General ADL Comments: overall S. PT does not feel she is at her baseline.     Vision Vision Assessment?: Yes Eye Alignment: Within Functional Limits Ocular Range of Motion: Within Functional Limits Alignment/Gaze Preference: Within Defined Limits Tracking/Visual  Pursuits: Decreased smoothness of horizontal tracking Saccades: Additional eye shifts occurred during testing;Additional head turns occurred during testing Convergence: Within functional limits Visual Fields: Left homonymous hemianopsia Additional Comments: no change from baseline   Perception     Praxis      Pertinent Vitals/Pain Pain Assessment: No/denies pain     Hand Dominance Right   Extremity/Trunk Assessment Upper Extremity Assessment Upper Extremity Assessment: LUE deficits/detail LUE Deficits / Details: residual spastic hemiparesis. Decreased in hand manipulation and coorindation skills. general weakness. LUE Coordination: decreased fine motor  C/o  L shoulder pain. ? Impingement from abnormal scapular/humeral rhythm   Lower Extremity Assessment Lower Extremity Assessment: LLE deficits/detail LLE Deficits / Details: Pt reports LLE is not at her bbaseline   Cervical / Trunk Assessment Cervical / Trunk Assessment: Normal   Communication Communication Communication: No difficulties   Cognition Arousal/Alertness: Awake/alert Behavior During Therapy: WFL for tasks assessed/performed Overall Cognitive Status: Within Functional Limits for tasks assessed                     General Comments       Exercises       Shoulder Instructions      Home Living Family/patient expects to be discharged to:: Private residence Living Arrangements: Spouse/significant other Available  Help at Discharge: Available PRN/intermittently;Family Type of Home: House Home Access: Stairs to enter [employer] of Steps: 2 Entrance Stairs-Rails: Right Home Layout: One level     Bathroom Shower/Tub: Tub/shower unit Shower/tub characteristics: Engineer, building services: Standard Bathroom Accessibility: Yes How Accessible: Accessible via walker Home Equipment: Cane - single point;Bedside commode      Lives With: Spouse    Prior Functioning/Environment Level of  Independence: Independent with assistive device(s)             OT Diagnosis: Generalized weakness;Hemiplegia non-dominant side;Disturbance of vision   OT Problem List: Decreased strength;Decreased range of motion;Impaired vision/perception;Decreased coordination;Obesity   OT Treatment/Interventions: Self-care/ADL training;Therapeutic activities;Visual/perceptual remediation/compensation;Patient/family education    OT Goals(Current goals can be found in the care plan section) Acute Rehab OT Goals Patient Stated Goal: Go home OT Goal Formulation: All assessment and education complete, DC therapy  OT Frequency:     Barriers to D/C:            Co-evaluation              End of Session Nurse Communication: Mobility status  Activity Tolerance: Patient tolerated treatment well Patient left: in bed;with call bell/phone within reach;with family/visitor present   Time: 1135-1152 OT Time Calculation (min): 17 min Charges:  OT General Charges $OT Visit: 1 Procedure OT Evaluation $OT Eval Moderate Complexity: 1 Procedure G-Codes:    Alicia Mcintyre,Alicia Mcintyre Feb 28, 2015, 12:09 PM   Alicia Mcintyre, OTR/L  [phone] Feb 28, 2015

## 2015-02-04 NOTE — Progress Notes (Signed)
Family Medicine Teaching Service Daily Progress Note Intern Pager: [phone]  Patient name: Alicia Mcintyre Medical record number: [id number] Date of birth: [birth year] Age: 70 y.o. Gender: female  Primary Care Provider: No primary care provider on file. Consultants: Neurology Code Status: DNR/DNI  Pt Overview and Major Events to Date:  1/25: Admitted to FMTS for stroke  Assessment and Plan: Alicia Mcintyre is a 70 y.o. female presenting with left lower extremity weakness and dysarthria that has resolved, concerning for TIA. PMH is significant for HTN, T2DM, Hx CVA in 2012 and 2015 with residual left sided weakness.  Acute ischemic stroke of right paramedian pons: Her exam is unchanged from yesterday, with 4/5 strength in the UE and LE, but her dysarthria continues to improve.    - Neurology consulted, appreciate recommendations - Continue ASA  daily and Plavix  daily - ECHO: EF 60-65%, G1DD - HgbA1c 8.1% - Lipid panel (Chol 218, TG 96, HDL 62, LDL 137) - TSH normal - Ezetimibe  daily, given allergy to statins - PT recommending no PT follow-up - SLP recommending no SLP follow-up - OT consult - Cardiac monitoring with continuous pulse ox - Neuro checks q4hrs - Follow-up with Neurologist on discharge.  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 Iron 31, TIBC 308, Ferritin 186.  - Daily CBCs - Home ferrous sulfate has been discontinued  HTN: Takes Labetalol  PO bid at home. BPs up to 179/52 overnight. - Initially holding Labetalol for permissive hypertension - Will switch to Carvedilol, given her G1DD and diabetes. Pt is allergic to ACEI/ARB.  T2DM: No A1c on file. CBG was 116 this am, 94 last night. - Continue home meds: Invokana  daily and Tradjenta  daily - Sensitive SSI - CBGs with meals and at bedtime - Hemoglobin A1c   Possible acute on chronic kidney disease: Cr 1.28 > 1.15 > 1.04. No previous values to  compare this to. FENa is 1.0%, consistent with prerenal cause of elevated creatinine. - Daily BMPs  FEN/GI: Heart healthy diet, SLIV Prophylaxis: Heparin sq  Disposition: Home likely today.  Subjective:  Pt states she is doing well this morning. She feels like her speech is getting better and was able to walk up and down the hall with physical therapy.  Objective: Temp:  [97.5 F (36.4 C)-98.5 F (36.9 C)] 98.2 F (36.8 C) (01/27 0517) Pulse Rate:  [60-65] 60 (01/27 0517) Resp:  [18-20] 20 (01/27 0517) BP: (139-179)/(38-61) 175/38 mmHg (01/27 0517) SpO2:  [90 %-99 %] 99 % (01/27 0517) Physical Exam: General: Well-appearing female, in NAD, talkative Eyes: EOMI, PERRLA ENTM: Oropharynx clear, MMM Neck: Supple Cardiovascular: RRR, no murmurs Respiratory: CTAB, normal work of breathing Abdomen: +BS, soft, non-tender, non-distended MSK: Warm and well-perfused, no edema Skin: No rashes or lesions Neuro: Awake, alert, CN 2-12 intact, 4/5 muscle strength in the left upper and lower extremity, 5/5 muscle strength in the right upper and lower extremity, dysarthria improved from yesterday. Psych: Appropriate affect, normal behavior  Laboratory:  Recent Labs Lab 02/02/15 1350 02/03/15 1258 02/04/15 0535  WBC 6.4 4.7 4.7  HGB 11.3* 12.8 12.3  HCT 35.7* 38.7 37.8  PLT 157 186 183    Recent Labs Lab 02/02/15 1350 02/03/15 1258 02/04/15 0535  NA 140 138 137  K 4.5 4.1 4.1  CL 108 104 105  CO2 BUN CREATININE 1.28* 1.15* 1.04*  CALCIUM 9.6 9.5 9.4  PROT  7.4  --   --   BILITOT 0.5  --   --   ALKPHOS 57  --   --   ALT 16  --   --   AST 16  --   --   GLUCOSE 147* 129* 119*   Lipid panel: Chol 218, TG 96, HDL 62, LDL 137 TSH: 0.674 Troponin <0.03 Iron panel: Iron 31, TIBC 308, Ferritin 186   Imaging/Diagnostic Tests: - CT head: small, old/subacute left frontoparietal infarct and old bilateral cerebral and cerebellar infarcts with no acute infarct  or intracranial hemorrhage - MRI brain: Patchy restricted diffusion within the right paramedian pons, consistent with acute ischemic nonhemorrhagic infarct. Multiple additional remote infarcts involving both cerebral hemispheres and cerebellar hemispheres bilaterally - CTA head/neck: Negative CTA neck. Multi focal severe stenosis within the proximal P2 segments bilaterally R > L. Focal moderate right M2 branch stenosis without occlusion. 2mm focal outpouching extending from the posterior aspect of the right supraclioid ICA, possible small aneurysm could have this appearance.  Campbell Stall, MD 02/04/2015, 7:04 AM PGY-1, [facility] Family Medicine FPTS Intern pager: [phone], text pages welcome

## 2015-02-04 NOTE — Care Management Note (Signed)
Case Management Note  Patient Details  Name: Emelda Kohlbeck MRN: [id number] Date of Birth: [birth year]  Subjective/Objective:                    Action/Plan: Plan is for patient to discharge home with her husband. OT recommending outpatient OT therapy. CM met with the patient and her husband and they are interested in [facility]. Patient given the map and number for Neurorehab. Patient ordered a tub bench. Insurance does not cover this and patient prefers to get this at outpatient setting. Bedside RN updated.   Expected Discharge Date:                  Expected Discharge Plan:  Home/Self Care  In-House Referral:     Discharge planning Services  CM Consult  Post Acute Care Choice:    Choice offered to:     DME Arranged:    DME Agency:     HH Arranged:    [place] Agency:     Status of Service:  Completed, signed off  Medicare Important Message Given:    Date Medicare IM Given:    Medicare IM give by:    Date Additional Medicare IM Given:    Additional Medicare Important Message give by:     If discussed at [place] of Stay Meetings, dates discussed:    Additional Comments:  Pollie Friar, RN 02/04/2015, 2:27 PM

## 2015-02-04 NOTE — Care Management Important Message (Signed)
Important Message  Patient Details  Name: Alicia Mcintyre MRN: [id number] Date of Birth: 04/30/1945   Medicare Important Message Given:  Yes    [name] Jalan Fariss 02/04/2015, 3:52 PM

## 2015-02-04 NOTE — Progress Notes (Signed)
STROKE TEAM PROGRESS NOTE   SUBJECTIVE (INTERVAL HISTORY) Husband at bedside. Patient is anxious to go home. She had multiple questions about her condition which were addressed by Dr. Pearlean Brownie and I.   OBJECTIVE Temp:  [97.9 F (36.6 C)-98.5 F (36.9 C)] 97.9 F (36.6 C) (01/27 1019) Pulse Rate:  [60-66] 66 (01/27 1019) Cardiac Rhythm:  [-] Normal sinus rhythm (01/27 0734) Resp:  [20] 20 (01/27 1019) BP: (148-175)/(38-59) 148/59 mmHg (01/27 1019) SpO2:  [90 %-99 %] 93 % (01/27 1019)  CBC:   Recent Labs Lab 02/02/15 1350 02/03/15 1258 02/04/15 0535  WBC 6.4 4.7 4.7  NEUTROABS 5.0  --   --   HGB 11.3* 12.8 12.3  HCT 35.7* 38.7 37.8  MCV 68.9* 67.8* 68.2*  PLT 157 186 183    Basic Metabolic Panel:   Recent Labs Lab 02/03/15 1258 02/04/15 0535  NA 138 137  K 4.1 4.1  CL 104 105  CO2 24 22  GLUCOSE 129* 119*  BUN 15 17  CREATININE 1.15* 1.04*  CALCIUM 9.5 9.4    Lipid Panel:     Component Value Date/Time   CHOL 218* 02/03/2015 0148   CHOL 211* 02/03/2015 0148   TRIG 96 02/03/2015 0148   TRIG 94 02/03/2015 0148   HDL 62 02/03/2015 0148   HDL 62 02/03/2015 0148   CHOLHDL 3.5 02/03/2015 0148   CHOLHDL 3.4 02/03/2015 0148   VLDL 19 02/03/2015 0148   VLDL 19 02/03/2015 0148   LDLCALC 137* 02/03/2015 0148   LDLCALC 130* 02/03/2015 0148   HgbA1c:  Lab Results  Component Value Date   HGBA1C 8.1* 02/03/2015   Urine Drug Screen:     Component Value Date/Time   LABOPIA NONE DETECTED 02/03/2015 1700   COCAINSCRNUR NONE DETECTED 02/03/2015 1700   LABBENZ NONE DETECTED 02/03/2015 1700   AMPHETMU NONE DETECTED 02/03/2015 1700   THCU NONE DETECTED 02/03/2015 1700   LABBARB NONE DETECTED 02/03/2015 1700      IMAGING  Ct Head Wo Contrast 02/02/2015  1. Small, old or subacute left frontoparietal infarct and old bilateral cerebral and cerebellar infarcts with no acute infarct or intracranial hemorrhage identified. 2. Mild to moderate chronic small vessel white  matter ischemic changes in both cerebral hemispheres. 3. Mild chronic bilateral maxillary sinusitis. Electronically Signed     CTA NECK  02/03/2015   1. Negative CTA of the neck. No high-grade or flow limiting stenosis. 2. Minimal atheromatous plaque at the origin of the left vertebral artery without significant stenosis. No other significant atheromatous disease within the major arterial vasculature of the neck.   CTA HEAD  02/03/2015   1. No large or proximal arterial branch occlusion within intracranial circulation. 2. Multi focal severe stenoses within the proximal P2 segments bilaterally, right worse than left. 3. Focal moderate right M2 branch stenosis without occlusion. 4. Mild atheromatous regularity artery without significant stenosis. 5. Scattered atheromatous plaque within the cavernous ICAs bilaterally with secondary mild diffuse narrowing. 6. 2 mm focal outpouching extending from the posterior aspect of the right supraclinoid ICA. Normal infundibulum is favored, although possible small aneurysm could also have this appearance. Attention at follow-up.   [name] 02/02/2015   1. Patchy restricted diffusion within the right paramedian pons, consistent with acute ischemic nonhemorrhagic infarct. No significant mass effect. 2. Multiple additional remote infarcts involving both cerebral hemispheres as well as the cerebellar hemispheres bilaterally as above. 3. Mild chronic small vessel ischemic disease.   2D Echocardiogram  -  Left ventricle: The cavity size was normal. Systolic function wasnormal. The estimated ejection fraction was in the range of 60%to 65%. Wall motion was normal; there were no regional wallmotion abnormalities. Doppler parameters are consistent withabnormal left ventricular relaxation (grade 1 diastolicdysfunction). Doppler parameters are consistent with highventricular filling pressure. - Aortic valve: Transvalvular velocity was within the normal range.There  was no stenosis. There was trivial regurgitation. - Mitral valve: There was mild regurgitation. - Right ventricle: The cavity size was normal. Wall thickness wasnormal. Systolic function was normal. - Pulmonary arteries: Systolic pressure was within the normalrange. - Inferior vena cava: The vessel was normal in size. Therespirophasic diameter changes were in the normal range (>= 50%),consistent with normal central venous pressure.   PHYSICAL EXAM Pleasant elderly African-American lady currently not in distress. . Afebrile. Head is nontraumatic. Neck is supple without bruit.    Cardiac exam no murmur or gallop. Lungs are clear to auscultation. Distal pulses are well felt. Neurological Exam :  Awake alert oriented 3 with normal speech and language function. No aphasia or apraxia dysarthria. Extraocular moments are full range without nystagmus. Fundi were not visualized. Vision acuity seems adequate. Partial left homonymous hemianopsia which is old as per the patient. Moderate left lower facial weakness. Tongue midline. Cough and gag are brisk. Mild left hemiparesis 4/5 strength with significant weakness of left grip and intrinsic hand muscles. Mild weakness of left hip flexor and ankle dorsiflexors. Tone is increased on the left side with mild spasticity. Impaired coordination on the left with mild diminished sensation on the left hemibody. Left plantar equivocal right downgoing. Gait was not tested.   ASSESSMENT/PLAN [name] is a 70 y.o. 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 She did not receive IV t-PA due to delay in arrival.   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 Do not recommend dual antiplatelets in this patient, but plavix alone. Discussed with patient and resident.  Patient counseled to be compliant with her antithrombotic medications  Ongoing aggressive stroke risk factor management  Therapy recommendations:  Supervision, OP OT  Disposition:  Return home  Followed by Dr[facility] Epimenio Foot at [facility] prior to admission, will need to follow up with him after discharge. Order written. Added to follow up  Stroke team will sign off  Hypertension  Stable  Hyperlipidemia  Home meds:  No statin  LDL 130-137, goal < 70  Started on zetia. Intolerant to statins in the past (respiratory distress, sob)  Diabetes  HgbA1c 8.1, goal < 7.0  Other Stroke Risk Factors  Advanced age  Obesity, Body mass index is 36.94 kg/(m^2).   Hx stroke/TIA - most likely due to atherosclerosis   2012-2015 sizable right PCA chronic infarct, a small left  MCA peripheral infarct, also right MCA small multiple infarcts. with residual neurological symptoms from her prior strokes in the form of left hemianopia, mild dysarthria , left mild spastic hemiparesis, gait instability.   Family hx stroke (mother, sister) obstructive sleep apnea, on CPAP at home  Other Active Problems  microcyttic anemia  Hospital day # 2  [facility] See Amion for Pager information 02/04/2015 2:25 PM  I have personally examined this patient, reviewed notes, independently viewed  imaging studies, participated in medical decision making and plan of care. I have made any additions or clarifications directly to the above note. Agree with note above.    Delia Heady, MD Medical Director [facility] Pager: [phone] 02/04/2015 4:32 PM   To contact Stroke Continuity provider, please refer to [email or web address]. After hours, contact General Neurology

## 2015-02-04 NOTE — Discharge Instructions (Signed)
You were hospitalized because you were having weakness of the left leg and some difficulty speaking. The MRI of your brain showed that you had a stroke. Your leg weakness and difficulty speaking improved while you were here.   We have changed your blood pressure medication from Labetalol to a medication called Metoprolol succinate (Troprol-XL) because it is better for your heart. Please take this medication daily. I have sent in a new prescription to your pharmacy.  The neurologists (brain doctors) recommended that you stop taking a daily Aspirin. Please continue to take the Plavix daily.  We have also added a new medication called Zetia. Please take this medication daily to lower your cholesterol. This medication will help prevent strokes in the future. This medication has been sent to your pharmacy.  We tested your iron level while you were here and it was normal. We think you can stop taking the ferrous sulfate.

## 2015-02-16 DIAGNOSIS — Z9189 Other specified personal risk factors, not elsewhere classified: Secondary | ICD-10-CM

## 2015-02-16 NOTE — Assessment & Plan Note (Signed)
25% decline of MDRD GFR consistent with being at-risk for kidney injury by RIFLE criteria.

## 2015-03-01 ENCOUNTER — Encounter: Payer: Self-pay | Admitting: Occupational Therapy

## 2015-03-01 ENCOUNTER — Ambulatory Visit: Payer: Medicare HMO | Attending: Family Medicine | Admitting: Occupational Therapy

## 2015-03-01 DIAGNOSIS — H547 Unspecified visual loss: Secondary | ICD-10-CM | POA: Insufficient documentation

## 2015-03-01 NOTE — Therapy (Signed)
[facility] [address] [place], [place], [address] Phone: [phone]   Fax:  [phone]  Occupational Therapy Evaluation  Patient Details  Name: Alicia Mcintyre MRN: [id number] Date of Birth: [birth year] Referring Provider: Dr. Allyn Kenner Mayo  Encounter Date: 03/01/2015      OT End of Session - 03/01/15 1313    Visit Number 1   Number of Visits 1   Authorization Type [employer] medicare   OT Start Time 1147   OT Stop Time 1228   OT Time Calculation (min) 41 min   Activity Tolerance Patient tolerated treatment well      Past Medical History  Diagnosis Date  . Diabetes mellitus without complication (HCC)   . Headache   . Hypertension   . Stroke (HCC)   . Vision abnormalities     Past Surgical History  Procedure Laterality Date  . Partial hysterectomy      has one ovary left/fim  . Appendectomy    . Cataract extraction      There were no vitals filed for this visit.  Visit Diagnosis:  Impaired vision      Subjective Assessment - 03/01/15 1153    Subjective  My vision isn't any different from my first stroke it is my walking and my balance.    Patient is accompained by: Family member  husband   Pertinent History see epic   Patient Stated Goals my balance and walking are different   Currently in Pain? No/denies           [facility] OT Assessment - 03/01/15 0001    Assessment   Diagnosis R CVA   Referring Provider Dr. Allyn Kenner Mayo   Onset Date 02/02/15   Prior Therapy PT, OT and ST with prior strokes   Precautions   Precautions None   Restrictions   Weight Bearing Restrictions No   Balance Screen   Has the patient fallen in the past 6 months No   Has the patient had a decrease in activity level because of a fear of falling?  Yes  will ask MD for referral for PT eval   Is the patient reluctant to leave their home because of a fear of falling?  Yes   Home  Environment   Family/patient expects  to be discharged to: Private residence   Living Arrangements Spouse/significant other   Available Help at Discharge Available 24 hours/day  hsuband goes for coffee in the morning while pt sleeps   Type of Home House   Home Layout One level   Bathroom Shower/Tub Tub/Shower unit   Bathroom Toilet Handicapped height   Additional Comments Pt has no equipment in bathroom.    Prior Function   Level of Independence Other (comment)  pt needed assistance for transportation    Vocation Retired   ADL   Eating/Feeding Independent   Grooming Independent   Comptroller - Conservator, museum/gallery -  Tour manager Independent   IADL   Shopping Needs to be accompanied on any shopping trip  for transportation; increased time due to visual deficits   Light Housekeeping Maintains house alone or with occasional assistance   Meal Prep Plans, prepares and serves adequate meals independently   Community Mobility Relies on family or friends for transportation  Medication Management Is responsible for taking medication in correct dosages at correct time   Financial Management Manages financial matters independently (budgets, writes checks, pays rent, bills goes to bank), collects and keeps track of income  with husband (did prior to first stroke)   Mobility   Mobility Status Needs assist   Mobility Status Comments Supervision in community right now as pt feels her balance is off since last stroke   Written Expression   Dominant Hand Right   Vision - History   Baseline Vision Other (comment)  pt with L hemianopsia since 2012 compensates well   Vision Assessment   Eye Alignment Within Functional Limits   Comment Pt with signficant L hemianopsia since 2012. Pt and husband state this is not worse  since last stroke and pt is able to compensate with head turns and increased time.   Activity Tolerance   Activity Tolerance Tolerates 10-20 min activity with muiltiple rests   Activity Tolerance Comments If heavier work;  can do about 30 minutes if routine work   Microbiologist Impaired  husband reports he feels memory is worse since last stroke.    Sensation   Light Touch Appears Intact   Hot/Cold Appears Intact   Proprioception Appears Intact   Coordination   Gross Motor Movements are Fluid and Coordinated Yes   Fine Motor Movements are Fluid and Coordinated No  pt states this is about the same as before   Other Pt feels LUE is about the same as before therefore did not do standardized testing.   Tone   Assessment Location Left Upper Extremity   ROM / Strength   AROM / PROM / Strength AROM;Strength   AROM   Overall AROM  Within functional limits for tasks performed   Overall AROM Comments For BUE's   Strength   Overall Strength Within functional limits for tasks performed   Hand Function   Right Hand Gross Grasp Functional   Right Hand Grip (lbs) 75   Left Hand Gross Grasp Functional   Left Hand Grip (lbs) 50   LUE Tone   LUE Tone Within Functional Limits                           OT Short Term Goals - 03/01/15 1307    OT SHORT TERM GOAL #1   Title n/a           OT Long Term Goals - 03/01/15 1307    OT LONG TERM GOAL #1   Title n/a               Plan - 03/01/15 1308    Clinical Impression Statement Pt is a 70 year old female with history of mulitple strokes (R PCA, LMCA, R MCA mulitple infarcts) as well as HTN, DM, OSA with residutal L hemianopsia and L hemiplegia from prior strokes. Pt wtih new onset of R CVA on 02/02/2015 - hospitalized from 1/25-1/27/207. Upon eval today, pt feels that the only difference with new stroke is her balance, and her walking (t"they are just a little off") and husband feels pt's memory is slightly worse  and that pt has small facial droop. Do not feel pt needs skilled OT at this time however do feel pt would benefit from PT and ST eval. Pt and husband in agreement and have asked for MD order.    Pt will benefit from skilled therapeutic intervention in order to improve  on the following deficits (Retired) --  n/a   Rehab Potential --  n/a   OT Frequency --  n/a   Plan no futher skilled OT needed at this time   Consulted and Agree with Plan of Care Patient;Family member/caregiver   Family Member Consulted husband          G-Codes - 31-Mar-2015 1315    Functional Limitation Carrying, moving and handling objects   Carrying, Moving and Handling Objects Current Status [phone]) At least 1 percent but less than 20 percent impaired, limited or restricted   Carrying, Moving and Handling Objects Goal Status (U0454) At least 1 percent but less than 20 percent impaired, limited or restricted   Carrying, Moving and Handling Objects Discharge Status [phone]) At least 1 percent but less than 20 percent impaired, limited or restricted      Problem List Patient Active Problem List   Diagnosis Date Noted  . At risk for renal failure 02/16/2015  . Cerebral thrombosis with cerebral infarction 02/03/2015  . Weakness 02/02/2015  . Left-sided weakness 02/02/2015  . Dysarthria   . Type 2 diabetes mellitus with complication (HCC)   . Essential hypertension   . Diabetes mellitus without complication (HCC) 11/01/2014  . Benign hypertension 11/01/2014  . OSA (obstructive sleep apnea) 11/01/2014  . History of CVA (cerebrovascular accident) 11/01/2014  . Gait disturbance 11/01/2014  . Pain of left thumb 11/01/2014    Norton Pastel, OTR/L 03-31-2015, 1:15 PM  [facility] [address] [place], [place], [address] Phone: [phone]   Fax:  [phone]  Name: Haliyah Fryman MRN: [id number] Date of Birth: 1945-08-27

## 2015-03-07 ENCOUNTER — Other Ambulatory Visit: Payer: Self-pay | Admitting: Neurology

## 2015-03-07 DIAGNOSIS — R531 Weakness: Secondary | ICD-10-CM

## 2015-03-07 DIAGNOSIS — Z8673 Personal history of transient ischemic attack (TIA), and cerebral infarction without residual deficits: Secondary | ICD-10-CM

## 2015-03-09 ENCOUNTER — Ambulatory Visit: Payer: Medicare HMO | Attending: Neurology

## 2015-03-09 DIAGNOSIS — R51 Headache: Secondary | ICD-10-CM

## 2015-03-09 DIAGNOSIS — R519 Headache, unspecified: Secondary | ICD-10-CM | POA: Insufficient documentation

## 2015-03-09 DIAGNOSIS — R471 Dysarthria and anarthria: Secondary | ICD-10-CM | POA: Insufficient documentation

## 2015-03-09 DIAGNOSIS — I639 Cerebral infarction, unspecified: Secondary | ICD-10-CM | POA: Insufficient documentation

## 2015-03-09 DIAGNOSIS — E785 Hyperlipidemia, unspecified: Secondary | ICD-10-CM | POA: Insufficient documentation

## 2015-03-09 DIAGNOSIS — E119 Type 2 diabetes mellitus without complications: Secondary | ICD-10-CM | POA: Insufficient documentation

## 2015-03-09 NOTE — Patient Instructions (Signed)
Cognitive Tips  Keep a journal/notebook with sections for the following (or use sections separately as needed):  Calendar and appointment sheet, schedule for each day, lists of reminders (such as grocery lists or "to do" list), homework assignments for therapy, important information such as family and friends names / addresses / phone numbers, medications, medical history and doctors name / phone numbers.  Avoid / remove clutter and unnecessary items from areas such as countertops / cabinets in kitchen and bathroom, closets, etc.  Organize items by purpose.  Baskets and bins help with this.  Leave notes for reminders above task to be completed.  For example: Note to turn off stove over the the stove; note to lock door beside the door, not to brush teeth then wash face by sink, note to take medication on table etc.  To help recall names of people of people or things, mentally or verbally go through the alphabet to try to determine the 1st letter of the word as this may trigger the name or word you are looking for.  If this is too difficult and someone else knows the word, have them give you the first letter by asking, "does it start with an "A", "B", "C" etc. (or have them give you the first sound of the word or some other clue).  Review family events, occasions, names, etc.  Pictures are a good way to trigger memory.  Have others correct you if you answer something incorrectly.  Have them speak slowly with a few words to give you time to process and respond.  Don't let others automatically problem-solve. (For example: don't let them automatically lay out clothes in the correct position, but hand it to you folded so that you can figure it out for yourself.)  However, if you need help with tasks, they should give you as little as they can so that you can be successful.  If appropriate and safe, they may allow you to make mistakes so that you can figure out how to correct the error.  (For example, they  may allow you to put your shoes on the wrong foot to see if you notice that is wrong).  If you struggle, they should give you a cue.  (Example: "Do your shoes feel right?"  "Do they look right?") 

## 2015-03-09 NOTE — Therapy (Signed)
[facility] [address] [place], [place], [address] Phone: [phone]   Fax:  [phone]  Speech Language Pathology Evaluation  Patient Details  Name: Alicia Mcintyre MRN: [id number] Date of Birth: 01-18-1945 Referring Provider: Despina Arias M.D.  Encounter Date: 03/09/2015      End of Session - 03/09/15 1704    Visit Number 1   Number of Visits 1   Date for SLP Re-Evaluation 03/09/15   SLP Start Time 0935   SLP Stop Time  1015   SLP Time Calculation (min) 40 min   Activity Tolerance Patient tolerated treatment well      Past Medical History  Diagnosis Date  . Diabetes mellitus without complication (HCC)   . Headache   . Hypertension   . Stroke (HCC)   . Vision abnormalities     Past Surgical History  Procedure Laterality Date  . Partial hysterectomy      has one ovary left/fim  . Appendectomy    . Cataract extraction      There were no vitals filed for this visit.  Visit Diagnosis: Dysarthria      Subjective Assessment - 03/09/15 0939    Subjective Pt reports no deficits, husband reports pt "look like she's talking out of one side of her mouth."   Patient is accompained by: Family member  Husband   Currently in Pain? No/denies            SLP Evaluation [facility] - 03/09/15 6962    SLP Visit Information   SLP Received On 03/09/15   Referring Provider Despina Arias M.D.   Onset Date 02-02-15   Medical Diagnosis CVA   Prior Functional Status   Cognitive/Linguistic Baseline Baseline deficits   Baseline deficit details Memory from previous CVA (2012)   Type of Home House    Lives With Spouse   Vocation Retired   IT consultant   Overall Cognitive Status History of cognitive impairments - at baseline   Memory Impaired   Memory Impairment Decreased short term memory  Pt/husband report that memory essentially at baseline   Auditory Comprehension   Overall Auditory Comprehension Appears within  functional limits for tasks assessed   Verbal Expression   Overall Verbal Expression Appears within functional limits for tasks assessed   Oral Motor/Sensory Function   Overall Oral Motor/Sensory Function Appears within functional limits for tasks assessed   Motor Speech   Overall Motor Speech Appears within functional limits for tasks assessed       Alternate motion tasks, including for articulatory stimuli, appeared WNL.                    SLP Education - 03/09/15 1004    Education provided Yes   Education Details memory tips   Person(s) Educated Patient;Spouse   Methods Explanation;Handout   Comprehension Verbalized understanding              Plan - 03/09/15 1704    Clinical Impression Statement Pt presents with speech and language-cognitive skills as reported, primarily at baseline. Additionally, pt's speech intelligiblity is at 100% without any notable decline in articulation. Pt will be seen for evaluation only - no therapy is recommended.   Speech Therapy Frequency One time visit   Consulted and Agree with Plan of Care Patient;Family member/caregiver   Family Member Consulted husband        Problem List Patient Active Problem List   Diagnosis Date Noted  . At risk for renal  failure 02/16/2015  . Cerebral thrombosis with cerebral infarction 02/03/2015  . Weakness 02/02/2015  . Left-sided weakness 02/02/2015  . Dysarthria   . Type 2 diabetes mellitus with complication (HCC)   . Essential hypertension   . Diabetes mellitus without complication (HCC) 11/01/2014  . Benign hypertension 11/01/2014  . OSA (obstructive sleep apnea) 11/01/2014  . History of CVA (cerebrovascular accident) 11/01/2014  . Gait disturbance 11/01/2014  . Pain of left thumb 11/01/2014    SCHINKE,[email or web address].[email or web address]  03/09/2015, 5:06 PM  [facility] [address] [place], [place], [address] Phone: [phone]   Fax:   [phone]  Name: Alicia Mcintyre MRN: [id number] Date of Birth: 1945-08-19

## 2015-03-10 ENCOUNTER — Ambulatory Visit: Payer: Medicare HMO | Admitting: Rehabilitation

## 2015-03-10 DIAGNOSIS — I639 Cerebral infarction, unspecified: Secondary | ICD-10-CM | POA: Insufficient documentation

## 2015-03-10 DIAGNOSIS — I6529 Occlusion and stenosis of unspecified carotid artery: Secondary | ICD-10-CM | POA: Insufficient documentation

## 2015-03-15 ENCOUNTER — Ambulatory Visit: Payer: Medicare HMO | Admitting: Neurology

## 2015-03-21 ENCOUNTER — Encounter: Payer: Self-pay | Admitting: Neurology

## 2015-03-28 ENCOUNTER — Observation Stay (HOSPITAL_COMMUNITY)
Admission: EM | Admit: 2015-03-28 | Discharge: 2015-03-29 | Disposition: A | Discharge status: Home / Self Care | Payer: Medicare HMO | Attending: Internal Medicine | Admitting: Internal Medicine

## 2015-03-28 ENCOUNTER — Observation Stay (HOSPITAL_COMMUNITY): Payer: Medicare HMO

## 2015-03-28 ENCOUNTER — Emergency Department (HOSPITAL_COMMUNITY): Payer: Medicare HMO

## 2015-03-28 ENCOUNTER — Observation Stay (HOSPITAL_BASED_OUTPATIENT_CLINIC_OR_DEPARTMENT_OTHER): Payer: Medicare HMO

## 2015-03-28 ENCOUNTER — Encounter (HOSPITAL_COMMUNITY): Payer: Self-pay | Admitting: Emergency Medicine

## 2015-03-28 DIAGNOSIS — Z7902 Long term (current) use of antithrombotics/antiplatelets: Secondary | ICD-10-CM | POA: Diagnosis not present

## 2015-03-28 DIAGNOSIS — R079 Chest pain, unspecified: Secondary | ICD-10-CM | POA: Diagnosis present

## 2015-03-28 DIAGNOSIS — R0600 Dyspnea, unspecified: Secondary | ICD-10-CM | POA: Diagnosis not present

## 2015-03-28 DIAGNOSIS — I10 Essential (primary) hypertension: Secondary | ICD-10-CM | POA: Diagnosis not present

## 2015-03-28 DIAGNOSIS — R072 Precordial pain: Secondary | ICD-10-CM | POA: Diagnosis not present

## 2015-03-28 DIAGNOSIS — M79662 Pain in left lower leg: Secondary | ICD-10-CM

## 2015-03-28 DIAGNOSIS — Z8673 Personal history of transient ischemic attack (TIA), and cerebral infarction without residual deficits: Secondary | ICD-10-CM | POA: Diagnosis not present

## 2015-03-28 DIAGNOSIS — R000 Tachycardia, unspecified: Secondary | ICD-10-CM | POA: Diagnosis not present

## 2015-03-28 DIAGNOSIS — Z79899 Other long term (current) drug therapy: Secondary | ICD-10-CM | POA: Insufficient documentation

## 2015-03-28 DIAGNOSIS — E118 Type 2 diabetes mellitus with unspecified complications: Secondary | ICD-10-CM | POA: Diagnosis present

## 2015-03-28 DIAGNOSIS — R791 Abnormal coagulation profile: Secondary | ICD-10-CM | POA: Diagnosis not present

## 2015-03-28 DIAGNOSIS — H539 Unspecified visual disturbance: Secondary | ICD-10-CM | POA: Insufficient documentation

## 2015-03-28 DIAGNOSIS — I2699 Other pulmonary embolism without acute cor pulmonale: Secondary | ICD-10-CM

## 2015-03-28 DIAGNOSIS — R7989 Other specified abnormal findings of blood chemistry: Secondary | ICD-10-CM | POA: Diagnosis present

## 2015-03-28 DIAGNOSIS — E119 Type 2 diabetes mellitus without complications: Secondary | ICD-10-CM | POA: Insufficient documentation

## 2015-03-28 DIAGNOSIS — I2609 Other pulmonary embolism with acute cor pulmonale: Secondary | ICD-10-CM

## 2015-03-28 LAB — GLUCOSE, CAPILLARY
GLUCOSE-CAPILLARY: 136 mg/dL — AB (ref 65–99)
GLUCOSE-CAPILLARY: 116 mg/dL — AB (ref 65–99)
GLUCOSE-CAPILLARY: 181 mg/dL — AB (ref 65–99)
GLUCOSE-CAPILLARY: 140 mg/dL — AB (ref 65–99)

## 2015-03-28 LAB — BASIC METABOLIC PANEL
ANION GAP: 13 (ref 5–15)
BUN: 27 mg/dL — ABNORMAL HIGH (ref 6–20)
CO2: 25 mmol/L (ref 22–32)
Calcium: 9.9 mg/dL (ref 8.9–10.3)
Chloride: 102 mmol/L (ref 101–111)
Creatinine, Ser: 1.24 mg/dL — ABNORMAL HIGH (ref 0.44–1.00)
GFR calc Af Amer: 51 mL/min — ABNORMAL LOW (ref >60)
GFR, EST NON AFRICAN AMERICAN: 44 mL/min — AB (ref >60)
Glucose, Bld: 173 mg/dL — ABNORMAL HIGH (ref 65–99)
POTASSIUM: 3.5 mmol/L (ref 3.5–5.1)
SODIUM: 140 mmol/L (ref 135–145)

## 2015-03-28 LAB — CBC
HCT: 38.7 % (ref 36.0–46.0)
HEMOGLOBIN: 12.8 g/dL (ref 12.0–15.0)
MCH: 22.7 pg — AB (ref 26.0–34.0)
MCHC: 33.1 g/dL (ref 30.0–36.0)
MCV: 68.5 fL — ABNORMAL LOW (ref 78.0–100.0)
PLATELETS: 224 10*3/uL (ref 150–400)
RBC: 5.65 MIL/uL — AB (ref 3.87–5.11)
RDW: 15.7 % — ABNORMAL HIGH (ref 11.5–15.5)
WBC: 8.2 10*3/uL (ref 4.0–10.5)

## 2015-03-28 LAB — TROPONIN I
TROPONIN I: 0.04 ng/mL — AB (ref <0.031)
Troponin I: 0.03 ng/mL (ref <0.031)
Troponin I: 0.03 ng/mL (ref <0.031)

## 2015-03-28 LAB — I-STAT TROPONIN, ED: TROPONIN I, POC: 0.03 ng/mL (ref 0.00–0.08)

## 2015-03-28 LAB — D-DIMER, QUANTITATIVE: D-Dimer, Quant: 0.86 ug/mL-FEU — ABNORMAL HIGH (ref 0.00–0.50)

## 2015-03-28 MED ORDER — ONDANSETRON HCL 4 MG/2ML IJ SOLN: 4.0000 mg | Freq: Four times a day (QID) | INTRAMUSCULAR | Status: DC | PRN

## 2015-03-28 MED ORDER — NITROGLYCERIN 0.4 MG SL SUBL
SUBLINGUAL_TABLET | SUBLINGUAL | Status: AC
  Administered 2015-03-28: 0.4 mg
  Filled 2015-03-28: qty 1

## 2015-03-28 MED ORDER — VITAMIN B-12 1000 MCG PO TABS
1000.0000 ug | ORAL_TABLET | Freq: Every day | ORAL | Status: DC
  Administered 2015-03-28 – 2015-03-29 (×2): 1000 ug via ORAL
  Filled 2015-03-28 (×2): qty 1

## 2015-03-28 MED ORDER — GABAPENTIN 100 MG PO CAPS
100.0000 mg | ORAL_CAPSULE | Freq: Three times a day (TID) | ORAL | Status: DC
  Administered 2015-03-28 – 2015-03-29 (×5): 100 mg via ORAL
  Filled 2015-03-28 (×5): qty 1

## 2015-03-28 MED ORDER — EZETIMIBE 10 MG PO TABS
10.0000 mg | ORAL_TABLET | Freq: Every day | ORAL | Status: DC
  Administered 2015-03-28 – 2015-03-29 (×2): 10 mg via ORAL
  Filled 2015-03-28 (×2): qty 1

## 2015-03-28 MED ORDER — FERROUS SULFATE 325 (65 FE) MG PO TABS
325.0000 mg | ORAL_TABLET | Freq: Every day | ORAL | Status: DC
Start: 2015-03-28 — End: 2015-03-29
  Administered 2015-03-28 – 2015-03-29 (×2): 325 mg via ORAL
  Filled 2015-03-28 (×2): qty 1

## 2015-03-28 MED ORDER — HYDRALAZINE HCL 50 MG PO TABS
50.0000 mg | ORAL_TABLET | Freq: Three times a day (TID) | ORAL | Status: DC
  Administered 2015-03-28 – 2015-03-29 (×4): 50 mg via ORAL
  Filled 2015-03-28 (×4): qty 1

## 2015-03-28 MED ORDER — ACETAMINOPHEN 325 MG PO TABS
650.0000 mg | ORAL_TABLET | ORAL | Status: DC | PRN
  Administered 2015-03-28 (×2): 650 mg via ORAL
  Filled 2015-03-28 (×2): qty 2

## 2015-03-28 MED ORDER — KETOROLAC TROMETHAMINE 30 MG/ML IJ SOLN: 30.0000 mg | Freq: Once | INTRAMUSCULAR | Status: DC

## 2015-03-28 MED ORDER — RIVAROXABAN 15 MG PO TABS
15.0000 mg | ORAL_TABLET | Freq: Once | ORAL | Status: AC
  Administered 2015-03-29: 15 mg via ORAL
  Filled 2015-03-28 (×2): qty 1

## 2015-03-28 MED ORDER — TRAMADOL HCL 50 MG PO TABS
50.0000 mg | ORAL_TABLET | Freq: Four times a day (QID) | ORAL | Status: DC | PRN
  Administered 2015-03-28 – 2015-03-29 (×2): 50 mg via ORAL
  Filled 2015-03-28 (×2): qty 1

## 2015-03-28 MED ORDER — SODIUM CHLORIDE 0.9 % IV SOLN
INTRAVENOUS | Status: DC
  Administered 2015-03-28 – 2015-03-29 (×2): via INTRAVENOUS

## 2015-03-28 MED ORDER — TECHNETIUM TC 99M DIETHYLENETRIAME-PENTAACETIC ACID: 32.0000 | Freq: Once | INTRAVENOUS | Status: DC | PRN

## 2015-03-28 MED ORDER — ASPIRIN 81 MG PO CHEW
81.0000 mg | CHEWABLE_TABLET | Freq: Every day | ORAL | Status: DC
  Administered 2015-03-28 – 2015-03-29 (×2): 81 mg via ORAL
  Filled 2015-03-28 (×2): qty 1

## 2015-03-28 MED ORDER — CLOPIDOGREL BISULFATE 75 MG PO TABS
75.0000 mg | ORAL_TABLET | Freq: Every day | ORAL | Status: DC
  Administered 2015-03-28 – 2015-03-29 (×2): 75 mg via ORAL
  Filled 2015-03-28 (×2): qty 1

## 2015-03-28 MED ORDER — AMLODIPINE BESYLATE 10 MG PO TABS
10.0000 mg | ORAL_TABLET | Freq: Every day | ORAL | Status: DC
  Administered 2015-03-28 – 2015-03-29 (×2): 10 mg via ORAL
  Filled 2015-03-28 (×2): qty 1

## 2015-03-28 MED ORDER — INSULIN ASPART 100 UNIT/ML ~~LOC~~ SOLN
0.0000 [IU] | Freq: Three times a day (TID) | SUBCUTANEOUS | Status: DC
  Administered 2015-03-28: 2 [IU] via SUBCUTANEOUS
  Administered 2015-03-28: 3 [IU] via SUBCUTANEOUS
  Administered 2015-03-29 (×2): 2 [IU] via SUBCUTANEOUS

## 2015-03-28 MED ORDER — MORPHINE SULFATE (PF) 2 MG/ML IV SOLN
2.0000 mg | INTRAVENOUS | Status: DC | PRN
  Administered 2015-03-28 – 2015-03-29 (×2): 2 mg via INTRAVENOUS
  Filled 2015-03-28 (×2): qty 1

## 2015-03-28 MED ORDER — TECHNETIUM TO 99M ALBUMIN AGGREGATED
4.2000 | Freq: Once | INTRAVENOUS | Status: AC | PRN
  Administered 2015-03-28: 4 via INTRAVENOUS

## 2015-03-28 NOTE — Evaluation (Signed)
Physical Therapy Evaluation Patient Details Name: Karen KitchensLela Talavera MRN: [id number] DOB: 08/13/1945 Today's Date: 03/28/2015   History of Present Illness  Pt is a 70 y/o F from SNF w/ acute onset of SOB and chest pain.  She was given 1 sublingual nitroglycerin and aspirin w/ resolution of symptoms. She was evaluated and found to have abnormal EKG w/ negative troponins. Her PMH is significant for Rt MCA/PCA CVA w/ Lt hemiparesis.    Clinical Impression  Pt admitted with above diagnosis. Pt currently with functional limitations due to the deficits listed below (see PT Problem List). Ms. Adrian BlackwaterStinson is very motivated to get stronger. She is from SNF where she required assist w/ transfers to chair and w/ ADLs.  She currently requires mod +2 assist for transfers w/ Lt knee buckle w/ WB. Pt will benefit from skilled PT to increase their independence and safety with mobility to allow discharge to the venue listed below.      Follow Up Recommendations SNF;Supervision/Assistance - 24 hour    Equipment Recommendations  Other (comment) (TBD at next venue of care)    Recommendations for Other Services       Precautions / Restrictions Precautions Precautions: Fall Restrictions Weight Bearing Restrictions: No      Mobility  Bed Mobility Overal bed mobility: Needs Assistance;+2 for physical assistance Bed Mobility: Rolling;Sidelying to Sit Rolling: Min assist Sidelying to sit: Min assist;+2 for physical assistance;HOB elevated       General bed mobility comments: Assist to boost trunk up to sitting, managing Bil LEs OOB, and use of bed pad for positioning  Transfers Overall transfer level: Needs assistance Equipment used: 2 person hand held assist Transfers: Sit to/from [employer]Stand;Stand Pivot Transfers Sit to Stand: Mod assist;+2 physical assistance;+2 safety/equipment Stand pivot transfers: Mod assist;+2 physical assistance;+2 safety/equipment       General transfer comment: Assist to boost up to  standing w/ gait belt in place and to steady.  Weight shifting Lt and Rt standing at bedside, Lt knee buckles w/ each weight shift and must be blocked.  Pt able to shuffle Rt foot during pivot but requires assist to slide Lt foot.  Ambulation/Gait             General Gait Details: unable to assess  Stairs            Wheelchair Mobility    Modified Rankin (Stroke Patients Only)       Balance Overall balance assessment: Needs assistance Sitting-balance support: Single extremity supported;Feet supported Sitting balance-Leahy Scale: Fair Sitting balance - Comments: Close min guard but pt able to maintain balance w/ UE reaching exercises sitting EOB   Standing balance support: Bilateral upper extremity supported;During functional activity Standing balance-Leahy Scale: Zero Standing balance comment: requires +2 mod assist                             Pertinent Vitals/Pain Pain Assessment: No/denies pain    Home Living Family/patient expects to be discharged to:: Skilled nursing facility                 Additional Comments: From SNF where she is planning to return    Prior Function Level of Independence: Needs assistance   Gait / Transfers Assistance Needed: Needs assist for stand pivot to chair  ADL's / Homemaking Assistance Needed: Needs assist for bathing, dressing        Hand Dominance   Dominant Hand: Right  Extremity/Trunk Assessment   Upper Extremity Assessment: Defer to OT evaluation           Lower Extremity Assessment: LLE deficits/detail;RLE deficits/detail RLE Deficits / Details: strength grossly 3/5 LLE Deficits / Details: 0/5 DF, 2+/5 hip flexion, 3/5 knee flexion and extension     Communication   Communication: No difficulties  Cognition Arousal/Alertness: Awake/alert Behavior During Therapy: WFL for tasks assessed/performed Overall Cognitive Status: No family/caregiver present to determine baseline cognitive  functioning Area of Impairment: Orientation Orientation Level: Disoriented to;Situation             General Comments: Lt neglect.  Could not remember why she was in the hospital    General Comments General comments (skin integrity, edema, etc.): Pt does well scanning her visual field to the Lt w/ min verbal cues    Exercises General Exercises - Lower Extremity Ankle Circles/Pumps: AROM;Both;5 reps;Seated      Assessment/Plan    PT Assessment Patient needs continued PT services  PT Diagnosis Difficulty walking;Hemiplegia non-dominant side   PT Problem List Decreased strength;Decreased range of motion;Decreased activity tolerance;Decreased balance;Decreased mobility;Decreased coordination;Decreased cognition;Decreased knowledge of use of DME;Decreased safety awareness;Impaired sensation  PT Treatment Interventions DME instruction;Gait training;Functional mobility training;Therapeutic activities;Therapeutic exercise;Balance training;Neuromuscular re-education;Cognitive remediation;Patient/family education   PT Goals (Current goals can be found in the Care Plan section) Acute Rehab PT Goals Patient Stated Goal: to get stronger PT Goal Formulation: With patient Time For Goal Achievement: 04/11/15 Potential to Achieve Goals: Good    Frequency Min 3X/week   Barriers to discharge        Co-evaluation PT/OT/SLP Co-Evaluation/Treatment: Yes Reason for Co-Treatment: For patient/therapist safety PT goals addressed during session: Mobility/safety with mobility;Balance;Strengthening/ROM         End of Session Equipment Utilized During Treatment: Gait belt Activity Tolerance: Patient tolerated treatment well;Patient limited by fatigue Patient left: in chair;with call bell/phone within reach;with chair alarm set Nurse Communication: Mobility status    Functional Assessment Tool Used: Clinical Judgement Functional Limitation: Mobility: Walking and moving around Mobility:  Walking and Moving Around Current Status [phone]): At least 60 percent but less than 80 percent impaired, limited or restricted Mobility: Walking and Moving Around Goal Status [phone]): At least 60 percent but less than 80 percent impaired, limited or restricted    Time: 1040-1103 PT Time Calculation (min) (ACUTE ONLY): 23 min   Charges:   PT Evaluation $PT Eval High Complexity: 1 Procedure     PT G Codes:   PT G-Codes **NOT FOR INPATIENT CLASS** Functional Assessment Tool Used: Clinical Judgement Functional Limitation: Mobility: Walking and moving around Mobility: Walking and Moving Around Current Status ([id number]): At least 60 percent but less than 80 percent impaired, limited or restricted Mobility: Walking and Moving Around Goal Status [phone]): At least 60 percent but less than 80 percent impaired, limited or restricted   Encarnacion Chu PT, DPT  Pager: [phone] Phone: [phone] 03/28/2015, 11:26 AM

## 2015-03-28 NOTE — Progress Notes (Signed)
Patient admitted this AM. H and P reviewed  Briefly, [age] year old female with a past medical history significant for right MCA/PCA stroke with left hemiparesis, hyperlipidemia, diabetes mellitus, hypertension; who presents acutely with complaints of chest pain from the skilled nursing facility.  Chest pain - Chest pain free at present - Cardiology following - 2d echo is ordered, pending - VQ ordered per below - Serial trop unremarkable Elevated d-dimer - LE dopplers neg - VQ ordered, pending - Possible secondary to recent CVA AKI - Avoid nephrotoxins - Follow renal function Recent CVA - seems stable at present DM2 -Cont on SSI coverage HTN - BP stable and controlled at present - Cont current regimen  Herron Fero K [facility] Pager [phone]. If 7PM-7AM, please contact night-coverage at [email or web address], password [email or web address]

## 2015-03-28 NOTE — H&P (Signed)
[facility] History and Physical  Yuka Lallier XBM:[id number] DOB: 06-24-1945 DOA: 03/28/2015  Referring physician: ED PCP: No primary care provider on file.   Chief Complaint: Chest pain   HPI: [name] is a 70 year old female with a past medical history significant for right MCA/PCA stroke with left hemiparesis, hyperlipidemia, diabetes mellitus, hypertension; who presents acutely with complaints of chest pain from the skilled nursing facility. She had been just recently discharged from [facility] on 3/10 after suffering worsening of her recent right PCA stroke and new right MCA stroke. She reports while laying in bed tonight have a acute onset of shortness of breath and chest pain which was sharp, but also reports pressure. She felt as though she could not breathe despite her taking deep breaths in. Associated symptoms of nausea, shortness of breath,and what she describes as choking sensation. Denies any fever, chills, or vomiting. While at the rehabilitation facility they have not been able to ambulate her and she is most of her time in either the bed or a chair. EMS was immediately called and she was given 1 sublingual nitroglycerin and 324 mg of aspirin in route with resolution of symptoms. Upon admission patient was evaluated and seen to have an abnormal EKG with negative troponins.   Review of Systems  Constitutional: Negative for weight loss.  HENT: Negative for ear pain and tinnitus.   Eyes: Negative for photophobia and pain.  Respiratory: Positive for shortness of breath.   Cardiovascular: Positive for chest pain. Negative for leg swelling.  Gastrointestinal: Positive for nausea. Negative for vomiting and abdominal pain.  Genitourinary: Positive for frequency. Negative for urgency.  Musculoskeletal: Negative for falls.  Skin: Negative for itching and rash.  Neurological: Positive for focal weakness. Negative for seizures and loss of consciousness.   Endo/Heme/Allergies: Negative for environmental allergies and polydipsia.  Psychiatric/Behavioral: Negative for suicidal ideas and substance abuse.        Past Medical History  Diagnosis Date  . Diabetes mellitus without complication (HCC)   . Headache   . Hypertension   . Stroke (HCC)   . Vision abnormalities      Past Surgical History  Procedure Laterality Date  . Partial hysterectomy      has one ovary left/fim  . Appendectomy    . Cataract extraction        Social History:  reports that she has never smoked. She has never used smokeless tobacco. She reports that she does not drink alcohol or use illicit drugs.   Allergies  Allergen Reactions  . Lipitor [Atorvastatin] Shortness Of Breath  . Zocor [Simvastatin] Shortness Of Breath  . Ace Inhibitors Cough  . Diovan [Valsartan] Cough  . Losartan Potassium Cough  . Metformin And Related Diarrhea  . Ranitidine Hcl Other (See Comments)    Headaches  . Actos [Pioglitazone] Rash    Family History  Problem Relation Age of Onset  . Hypertension Mother   . Stroke Mother   . Lupus Mother   . Hypertension Father   . Hypertension Sister   . Sleep apnea Sister   . Stroke Sister   . Diabetes Brother   . Hypertension Brother   . Healthy Brother   . Diabetes Brother   . Healthy Brother   . Healthy Brother   . Diabetes Sister   . Healthy Sister   . Healthy Sister   . Sleep apnea Sister   . Hypertension Sister        Prior to Admission  medications   Medication Sig Start Date End Date Taking? Authorizing Provider  amLODipine (NORVASC) 5 MG tablet Take 10 mg by mouth daily.   Yes Historical Provider, MD  aspirin 81 MG chewable tablet Chew 81 mg by mouth daily.   Yes Historical Provider, MD  canagliflozin (INVOKANA) 100 MG TABS tablet Take 100 mg by mouth daily.   Yes Historical Provider, MD  clopidogrel (PLAVIX) 75 MG tablet Take 1 tablet (75 mg total) by mouth daily. 11/01/14  Yes Asa Lente, MD   ezetimibe (ZETIA) 10 MG tablet Take 1 tablet (10 mg total) by mouth daily. 02/04/15  Yes Campbell Stall, MD  ferrous sulfate 325 (65 FE) MG tablet Take 325 mg by mouth daily with breakfast.   Yes Historical Provider, MD  gabapentin (NEURONTIN) 100 MG capsule Take 100 mg by mouth 3 (three) times daily.   Yes Historical Provider, MD  hydrALAZINE (APRESOLINE) 25 MG tablet Take 50 mg by mouth every 8 (eight) hours.   Yes Historical Provider, MD  linagliptin (TRADJENTA) 5 MG TABS tablet Take 5 mg by mouth daily.   Yes Historical Provider, MD  Omega-3 Fatty Acids (FISH OIL) 1000 MG CAPS Take 2,000 mg by mouth daily. Taking 2 capsules twice daily   Yes Historical Provider, MD  traMADol (ULTRAM) 50 MG tablet Take 1 tablet (50 mg total) by mouth every 6 (six) hours as needed. Patient taking differently: Take 50 mg by mouth every 6 (six) hours as needed.  11/01/14  Yes Asa Lente, MD  vitamin B-12 (CYANOCOBALAMIN) 1000 MCG tablet Take 1,000 mcg by mouth daily.   Yes Historical Provider, MD     Physical Exam: Filed Vitals:   03/28/15 0300 03/28/15 0330 03/28/15 0528 03/28/15 0600  BP: 152/73 157/80 153/67 140/57  Pulse: 84 82 79 81  Temp:   98.3 F (36.8 C) 98.4 F (36.9 C)  TempSrc:   Oral Oral  Resp:  SpO2: 97% 96% 98% 99%     Constitutional: Vital signs reviewed. Patient is a well-developed and well-nourished in no acute distress and cooperative with exam. Alert and oriented x3.  Head: Normocephalic and atraumatic  Ear: TM normal bilaterally  Mouth: no erythema or exudates, MMM  Eyes: PERRL, EOMI, conjunctivae normal, No scleral icterus.  Neck: Supple, Trachea midline normal ROM, No JVD, mass, thyromegaly, or carotid bruit present.  Cardiovascular: RRR, S1 normal, S2 normal, no MRG, pulses symmetric and intact bilaterally  Pulmonary/Chest: CTAB, no wheezes, rales, or rhonchi  Abdominal: Soft. Non-tender, non-distended, bowel sounds are normal, no masses, organomegaly, or  guarding present.  GU: no CVA tenderness Musculoskeletal: No joint deformities, erythema, or stiffness, ROM full and no nontender Ext: no edema and no cyanosis, pulses palpable bilaterally (DP and PT)  Hematology: no cervical, inginal, or axillary adenopathy.  Neurological: A&O x3, left-sided hemiplegia  Skin: Warm, dry and intact. No rash, cyanosis, or clubbing.  Psychiatric: Normal mood and affect. speech and behavior is normal. Judgment and thought content normal. Cognition and memory are normal.      Data Review   Micro Results No results found for this or any previous visit (from the past 240 hour(s)).  Radiology Reports Dg Chest 2 View  03/28/2015  CLINICAL DATA:  Chest pain. EXAM: CHEST  2 VIEW COMPARISON:  None. FINDINGS: The heart size and mediastinal contours are within normal limits. Both lungs are clear. The visualized skeletal structures are unremarkable. IMPRESSION: No active cardiopulmonary disease. Electronically Signed  By: Ellery Plunkaniel R Mitchell M.D.   On: 03/28/2015 02:37     CBC  Recent Labs Lab 03/28/15 0245  WBC 8.2  HGB 12.8  HCT 38.7  PLT 224  MCV 68.5*  MCH 22.7*  MCHC 33.1  RDW 15.7*    Chemistries   Recent Labs Lab 03/28/15 0245  NA 140  K 3.5  CL 102  CO2 25  GLUCOSE 173*  BUN 27*  CREATININE 1.24*  CALCIUM 9.9   ------------------------------------------------------------------------------------------------------------------ CrCl cannot be calculated (Unknown ideal weight.). ------------------------------------------------------------------------------------------------------------------ No results for input(s): HGBA1C in the last 72 hours. ------------------------------------------------------------------------------------------------------------------ No results for input(s): CHOL, HDL, LDLCALC, TRIG, CHOLHDL, LDLDIRECT in the last 72  hours. ------------------------------------------------------------------------------------------------------------------ No results for input(s): TSH, T4TOTAL, T3FREE, THYROIDAB in the last 72 hours.  Invalid input(s): FREET3 ------------------------------------------------------------------------------------------------------------------ No results for input(s): VITAMINB12, FOLATE, FERRITIN, TIBC, IRON, RETICCTPCT in the last 72 hours.  Coagulation profile No results for input(s): INR, PROTIME in the last 168 hours.   Recent Labs  03/28/15 0420  DDIMER 0.86*    Cardiac Enzymes No results for input(s): CKMB, TROPONINI, MYOGLOBIN in the last 168 hours.  Invalid input(s): CK ------------------------------------------------------------------------------------------------------------------ Invalid input(s): POCBNP   CBG: No results for input(s): GLUCAP in the last 168 hours.     EKG: Independently reviewed.  sinus rhythm, Q waves in the inferior leads, what appears to be diffuse ST elevation suggestive of pericarditis, and prolonged QTC of 540    Assessment/Plan Chest pain: Acute. Patient reports associated symptoms of shortness of breath. Note she has been relatively bedbound since getting to the rehabilitation facility. Noted to have elevated d-dimer of 0.86 question of this secondary to her recent stroke versus new clot. Ultrasound of the lower extremities was ordered by the ED physician and she was started on Xarelto x1 dose. - Admit to telemetry bed - Repeat EKG at 6 am - Echocardiogram   - Trend cardiac troponins - Follow-up ultrasound of lower extremities - Determine risk/benefits of continuing Xarelto given history of recent stroke and size vs. possible need of IVC filter if clot is found  Abnormal EKG: Most recent EKG showed Q waves in the inferior leads and what appears to be diffuse ST elevation suggestive of pericarditis. Prolonged QTC of 540 - Follow-up repeat  EKG - We'll need to consult cardiology and  Elevated d-dimer: Patient expressed some complaints of left leg pain. - as seen above, but may also want to obtain CTA of chest  Acute kidney injury: Patient's baseline creatinine function was less than 1 when reviewing [facility] records. Acutely on admission creatinine elevated at 1.24 with a BUN 27. Suggesting a prerenal state. - IVF 100 ml/hr  Recent CVA. [facility] from 03/09/2015 2 03/18/2015 after a right MCA stroke - continued on aspirin and Plavix - PT /OT to eval and treat  Diabetes mellitus type 2: Last hemoglobin A1c is 7.7 - Held invokana and linagliptin - CBGs every before meals with moderate sliding scale insulin  Essential hypertension - Continue amlodipine and hydralazine   Code Status:   DNR Family Communication: bedside Disposition Plan: admit   Total time spent 55 minutes.Greater than 50% of this time was spent in counseling, explanation of diagnosis, planning of further management, and coordination of care  Clydie BraunRondell A Smith [facility] Pager [phone]  If 7PM-7AM, please contact night-coverage [email or web address] Password [email or web address] 03/28/2015, 6:33 AM

## 2015-03-28 NOTE — NC FL2 (Signed)
[place] MEDICAID FL2 LEVEL OF CARE SCREENING TOOL     IDENTIFICATION  Patient Name: Alicia Mcintyre Birthdate: Aug 19, 1945 Sex: female Admission Date (Current Location): 03/28/2015  [facility] and [place] Number:  Producer, television/film/video and Address:  [facility], [address], [place], [place] [address]      Provider Number: [id number]  Attending Physician Name and Address:  Jerald Kief, MD  Relative Name and Phone Number:       Current Level of Care: Hospital Recommended Level of Care: Skilled Nursing Facility Prior Approval Number:    Date Approved/Denied:   PASRR Number:   [id number]  Discharge Plan: SNF    Current Diagnoses: Patient Active Problem List   Diagnosis Date Noted  . Chest pain 03/28/2015  . Elevated d-dimer 03/28/2015  . At risk for renal failure 02/16/2015  . Cerebral thrombosis with cerebral infarction 02/03/2015  . Weakness 02/02/2015  . Left-sided weakness 02/02/2015  . Dysarthria   . Type 2 diabetes mellitus with complication (HCC)   . Essential hypertension   . Diabetes mellitus without complication (HCC) 11/01/2014  . Benign hypertension 11/01/2014  . OSA (obstructive sleep apnea) 11/01/2014  . History of CVA (cerebrovascular accident) 11/01/2014  . Gait disturbance 11/01/2014  . Pain of left thumb 11/01/2014    Orientation RESPIRATION BLADDER Height & Weight     Self, Place  Normal Incontinent Weight: 178 lb 9.6 oz (81.012 kg) Height:   (157.5 cm)  BEHAVIORAL SYMPTOMS/MOOD NEUROLOGICAL BOWEL NUTRITION STATUS  Other (Comment) (appropriate)  (n/a) Continent Diet (Carb Modified)  AMBULATORY STATUS COMMUNICATION OF NEEDS Skin   Extensive Assist Verbally Normal                       Personal Care Assistance Level of Assistance  Bathing, Feeding, Dressing Bathing Assistance: Maximum assistance Feeding assistance: Limited assistance Dressing Assistance: Limited assistance     Functional Limitations  Info  Sight, Hearing, Speech Sight Info: Impaired (Loss of peripheral) Hearing Info: Adequate Speech Info: Adequate    SPECIAL CARE FACTORS FREQUENCY  PT (By licensed PT), OT (By licensed OT)     PT Frequency: 5x/week OT Frequency: 5x/week            Contractures Contractures Info: Not present    Additional Factors Info  Allergies, Code Status, Insulin Sliding Scale Code Status Info: DNR Allergies Info: Lipitor, Zocor, Ace Inhibitors, Diovan, Losartan Potassium, Metformin And Related, Ranitidine Hcl, Actos   Insulin Sliding Scale Info: 0-15 units, 3x/day       Current Medications (03/28/2015):  This is the current hospital active medication list Current Facility-Administered Medications  Medication Dose Route Frequency Provider Last Rate Last Dose  . 0.9 %  sodium chloride infusion   Intravenous Continuous Clydie Braun, MD      . acetaminophen (TYLENOL) tablet 650 mg  650 mg Oral Q4H PRN Clydie Braun, MD   650 mg at 03/28/15 1206  . amLODipine (NORVASC) tablet 10 mg  10 mg Oral Daily Clydie Braun, MD   10 mg at 03/28/15 0810  . aspirin chewable tablet 81 mg  81 mg Oral Daily Clydie Braun, MD   81 mg at 03/28/15 0810  . clopidogrel (PLAVIX) tablet 75 mg  75 mg Oral Daily Clydie Braun, MD   75 mg at 03/28/15 0809  . ezetimibe (ZETIA) tablet 10 mg  10 mg Oral Daily Clydie Braun, MD   10 mg  at 03/28/15 0810  . ferrous sulfate tablet 325 mg  325 mg Oral Q breakfast Clydie Braunondell A Smith, MD   325 mg at 03/28/15 0810  . gabapentin (NEURONTIN) capsule 100 mg  100 mg Oral TID Clydie Braunondell A Smith, MD   100 mg at 03/28/15 0809  . hydrALAZINE (APRESOLINE) tablet 50 mg  50 mg Oral 3 times per day Clydie Braunondell A Smith, MD      . insulin aspart (novoLOG) injection 0-15 Units  0-15 Units Subcutaneous TID WC Clydie Braunondell A Smith, MD   2 Units at 03/28/15 0809  . morphine 2 MG/ML injection 2 mg  2 mg Intravenous Q2H PRN Clydie Braunondell A Smith, MD      . ondansetron (ZOFRAN) injection 4 mg  4 mg  Intravenous Q6H PRN Clydie Braunondell A Smith, MD      . Rivaroxaban (XARELTO) tablet 15 mg  15 mg Oral Once Derwood KaplanAnkit Nanavati, MD      . traMADol (ULTRAM) tablet 50 mg  50 mg Oral Q6H PRN Clydie Braunondell A Smith, MD      . vitamin B-12 (CYANOCOBALAMIN) tablet 1,000 mcg  1,000 mcg Oral Daily Clydie Braunondell A Smith, MD   1,000 mcg at 03/28/15 [id number]     Discharge Medications: Please see discharge summary for a list of discharge medications.  Relevant Imaging Results:  Relevant Lab Results:   Additional Information SS# [id number]   Jenita SeashoreMaggie Cox BSW Intern, [phone]

## 2015-03-28 NOTE — ED Provider Notes (Signed)
2:27, pt taken for xrays. EKG has some subtle concerning features. Will see her as soon as she is back.  Derwood KaplanAnkit Colburn Asper, MD 03/28/15 [phone]

## 2015-03-28 NOTE — Progress Notes (Signed)
Pt c/o chest pain that radiates up her left jaw, rated 8 out of 10. Pt BP 156/62 HR 105, pt given nitro x1. Pt rates pain 4 out of 10 after one nitro. Pt BP 127/64 HR 91. Pt rates pain 0 out 10 before second nitro was given. EKG performed. Will continue to monitor.   Reginold AgentWhitney Sonya Gunnoe, RN

## 2015-03-28 NOTE — Care Management Obs Status (Signed)
MEDICARE OBSERVATION STATUS NOTIFICATION   Patient Details  Name: Alicia KitchensLela Parke MRN: [id number] Date of Birth: December 13, 1945   Medicare Observation Status Notification Given:  Yes (chest pain)    Gala LewandowskyGraves-Bigelow, Satoria Dunlop Kaye, RN 03/28/2015, 10:50 AM

## 2015-03-28 NOTE — ED Notes (Signed)
[name] [place] [id number] with c/o chest pain ongoing for 20 minutes. Reported as "sharp" and similar to gas pains, sternal in location. At this time patient is pain free, and has been for about an hour. Received 1 SL from SNF, and 324 MG aspirin from EMS. Hx CVA - left sided paralysis remains. Pt has DNR at bedside, but papers have not been signed by an MD.

## 2015-03-28 NOTE — Evaluation (Signed)
Occupational Therapy Evaluation Patient Details Name: Alicia Mcintyre MRN: [id number] DOB: 08/04/1945 Today's Date: 03/28/2015    History of Present Illness Pt is a 70 y/o F from SNF w/ acute onset of SOB and chest pain.  She was given 1 sublingual nitroglycerin and aspirin w/ resolution of symptoms. She was evaluated and found to have abnormal EKG w/ negative troponins. Her PMH is significant for Rt MCA/PCA CVA w/ Lt hemiparesis.   Clinical Impression   Pt was reliant on staff at SNF for assistance with bathing, dressing and toileting.  She was self feeding and grooming.  She was assisted to stand and pivot. Pt presents with L hemiparesis with impaired sensation, L visual field deficit and impaired balance. She works hard and readily agreed to work with therapy and get OOB. Recommend return to SNF for further therapy. Will follow acutely.    Follow Up Recommendations  SNF;Supervision/Assistance - 24 hour    Equipment Recommendations       Recommendations for Other Services       Precautions / Restrictions Precautions Precautions: Fall Restrictions Weight Bearing Restrictions: No      Mobility Bed Mobility Overal bed mobility: Needs Assistance;+2 for physical assistance Bed Mobility: Rolling;Sidelying to Sit Rolling: Min assist Sidelying to sit: Min assist;+2 for physical assistance;HOB elevated       General bed mobility comments: Assist to boost trunk up to sitting, managing Bil LEs OOB, and use of bed pad for positioning  Transfers Overall transfer level: Needs assistance Equipment used: 2 person hand held assist Transfers: Sit to/from [employer] Sit to Stand: Mod assist;+2 physical assistance;+2 safety/equipment Stand pivot transfers: Mod assist;+2 physical assistance;+2 safety/equipment       General transfer comment: Assist to boost up to standing w/ gait belt in place and to steady.  Weight shifting Lt and Rt standing at bedside, Lt knee buckles  w/ each weight shift and must be blocked.  Pt able to shuffle Rt foot during pivot but requires assist to slide Lt foot.    Balance Overall balance assessment: Needs assistance Sitting-balance support: Single extremity supported Sitting balance-Leahy Scale: Fair Sitting balance - Comments: Close min guard but pt able to maintain balance w/ UE reaching exercises sitting EOB   Standing balance support: Bilateral upper extremity supported;During functional activity Standing balance-Leahy Scale: Zero Standing balance comment: requires B UEs on walker                            ADL Overall ADL's : Needs assistance/impaired Eating/Feeding: Sitting;Minimal assistance   Grooming: Wash/dry hands;Wash/dry face;Minimal assistance;Sitting   Upper Body Bathing: Maximal assistance;Sitting   Lower Body Bathing: Total assistance;+2 for physical assistance;Sit to/from stand   Upper Body Dressing : Maximal assistance;Sitting   Lower Body Dressing: Total assistance;+2 for physical assistance;Sit to/from stand   Toilet Transfer: +2 for physical assistance;Moderate assistance;Stand-pivot;BSC   Toileting- Clothing Manipulation and Hygiene: Total assistance;+2 for physical assistance;Sit to/from stand               Vision Vision Assessment?: Yes Eye Alignment: Within Functional Limits Visual Fields: Left visual field deficit Diplopia Assessment:  (reports occasional diplopia) Depth Perception: Undershoots Additional Comments: scans across midline to locate visual target with verbal cues   Perception     Praxis      Pertinent Vitals/Pain Pain Assessment: No/denies pain     Hand Dominance Right   Extremity/Trunk Assessment Upper Extremity Assessment Upper Extremity Assessment: LUE deficits/detail  LUE Deficits / Details: hemiparesis, no functional use, 2-/5 shoulder, 3/5 elbow, 2/5 forearm, 2/5 gross grasp LUE Sensation: decreased light touch;decreased proprioception LUE  Coordination: decreased fine motor;decreased gross motor   Lower Extremity Assessment Lower Extremity Assessment: Defer to PT evaluation RLE Deficits / Details: strength grossly 3/5 LLE Deficits / Details: 0/5 DF, 2+/5 hip flexion, 3/5 knee flexion and extension LLE Sensation: decreased light touch (no sensation to light touch throughout Lt LE) LLE Coordination: decreased gross motor;decreased fine motor       Communication Communication Communication: No difficulties   Cognition Arousal/Alertness: Awake/alert Behavior During Therapy: WFL for tasks assessed/performed Overall Cognitive Status: No family/caregiver present to determine baseline cognitive functioning Area of Impairment: Orientation Orientation Level: Disoriented to;Situation             General Comments: Lt neglect.  Could not remember why she was in the hospital   General Comments       Exercises Exercises: General Lower Extremity     Shoulder Instructions      Home Living Family/patient expects to be discharged to:: Skilled nursing facility                                 Additional Comments: From SNF where she is planning to return      Prior Functioning/Environment Level of Independence: Needs assistance  Gait / Transfers Assistance Needed: Needs assist for stand pivot to chair ADL's / Homemaking Assistance Needed: Needs assist for bathing, dressing        OT Diagnosis: Generalized weakness;Cognitive deficits;Disturbance of vision;Hemiplegia non-dominant side   OT Problem List: Decreased strength;Decreased activity tolerance;Impaired balance (sitting and/or standing);Decreased cognition;Decreased knowledge of use of DME or AE;Pain;Impaired UE functional use;Impaired sensation;Impaired tone;Decreased coordination;Impaired vision/perception   OT Treatment/Interventions: Self-care/ADL training;DME and/or AE instruction;Visual/perceptual remediation/compensation;Cognitive  remediation/compensation;Therapeutic activities;Patient/family education;Balance training;Neuromuscular education    OT Goals(Current goals can be found in the care plan section) Acute Rehab OT Goals Patient Stated Goal: to get stronger OT Goal Formulation: With patient Time For Goal Achievement: 04/11/15 Potential to Achieve Goals: Good ADL Goals Pt Will Perform Grooming: with supervision;sitting Pt Will Perform Upper Body Dressing: with min assist;sitting Pt Will Transfer to Toilet: with mod assist;stand pivot transfer;bedside commode Pt/caregiver will Perform Home Exercise Program: Increased ROM;Left upper extremity;With minimal assist (L UE self ROM) Additional ADL Goal #1: Pt will located visual targets in L hemispace with 75% accuracy without cues. Additional ADL Goal #2: Pt will perform bed mobility with mod assist in preparation for ADL at EOB.  OT Frequency: Min 2X/week   Barriers to D/C:            Co-evaluation PT/OT/SLP Co-Evaluation/Treatment: Yes Reason for Co-Treatment: For patient/therapist safety PT goals addressed during session: Mobility/safety with mobility;Balance;Strengthening/ROM OT goals addressed during session: ADL's and self-care      End of Session Equipment Utilized During Treatment: Gait belt;Rolling walker  Activity Tolerance: Patient tolerated treatment well Patient left: in chair;with call bell/phone within reach;with chair alarm set   Time: 1610-9604 OT Time Calculation (min): 24 min Charges:  OT General Charges $OT Visit: 1 Procedure OT Evaluation $OT Eval Moderate Complexity: 1 Procedure G-Codes: OT G-codes **NOT FOR INPATIENT CLASS** Functional Assessment Tool Used: clinical judgement Functional Limitation: Self care Self Care Current Status (V4098): At least 80 percent but less than 100 percent impaired, limited or restricted Self Care Goal Status (J1914): At least 80 percent but less than 100 percent  impaired, limited or  restricted Self Care Discharge Status [phone](G8989): At least 60 percent but less than 80 percent impaired, limited or restricted  Evern BioMayberry, Danniel Tones Lynn 03/28/2015, 1:22 PM  [phone]

## 2015-03-28 NOTE — ED Provider Notes (Signed)
CSN: [id number]     Arrival date & time 03/28/15  0156 History  By signing my name below, I, Phillis HaggisGabriella Gaje, attest that this documentation has been prepared under the direction and in the presence of Derwood KaplanAnkit Ilhan Debenedetto, MD. Electronically Signed: Phillis HaggisGabriella Gaje, ED Scribe. 03/28/2015. 2:49 AM.   Chief Complaint  Patient presents with  . Chest Pain   The history is provided by the patient. No language interpreter was used.  HPI Comments: Alicia Mcintyre is a 70 y.o. female with a hx of DM, HTN, hypercholesteremia and stroke brought in by EMS from SNF who presents to the Emergency Department complaining of gradually resolving, sharp, pressured, left sternal chest pain onset PTA. She states that her pain radiated to her left neck and left arm. She reports associated SOB during the episode. Pt reports being pain free for about an hour. She received 1 SL from SNF and 324 mg ASA from EMS. Pt had a normal stress test performed one year ago. She reports that she has been having exertional SOB since her stroke; she has been in rehab ever since and is ambulatory by wheelchair. She denies hx of MI, hx of lung disease, hx of smoking, nausea or diaphoresis.   Past Medical History  Diagnosis Date  . Diabetes mellitus without complication (HCC)   . Headache   . Hypertension   . Stroke (HCC)   . Vision abnormalities    Past Surgical History  Procedure Laterality Date  . Partial hysterectomy      has one ovary left/fim  . Appendectomy    . Cataract extraction     Family History  Problem Relation Age of Onset  . Hypertension Mother   . Stroke Mother   . Lupus Mother   . Hypertension Father   . Hypertension Sister   . Sleep apnea Sister   . Stroke Sister   . Diabetes Brother   . Hypertension Brother   . Healthy Brother   . Diabetes Brother   . Healthy Brother   . Healthy Brother   . Diabetes Sister   . Healthy Sister   . Healthy Sister   . Sleep apnea Sister   . Hypertension Sister    Social  History  Substance Use Topics  . Smoking status: Never Smoker   . Smokeless tobacco: Never Used  . Alcohol Use: No   OB History    No data available     Review of Systems 10 Systems reviewed and all are negative for acute change except as noted in the HPI.   Allergies  Lipitor; Zocor; Ace inhibitors; Diovan; Losartan potassium; Metformin and related; Ranitidine hcl; and Actos  Home Medications   Prior to Admission medications   Medication Sig Start Date End Date Taking? Authorizing Provider  canagliflozin (INVOKANA) 100 MG TABS tablet Take 100 mg by mouth daily.    Historical Provider, MD  cholecalciferol (VITAMIN D) 1000 UNITS tablet Take 5,000 Units by mouth daily.    Historical Provider, MD  clopidogrel (PLAVIX) 75 MG tablet Take 1 tablet (75 mg total) by mouth daily. 11/01/14   Asa Lenteichard A Sater, MD  ezetimibe (ZETIA) 10 MG tablet Take 1 tablet (10 mg total) by mouth daily. 02/04/15   Campbell StallKaty Dodd Mayo, MD  linagliptin (TRADJENTA) 5 MG TABS tablet Take 5 mg by mouth daily.    Historical Provider, MD  metoprolol succinate (TOPROL XL) 25 MG 24 hr tablet Take 1 tablet (25 mg total) by mouth daily. 02/04/15   Orpha BurKaty  Gay Filler, MD  Omega-3 Fatty Acids (FISH OIL) 1000 MG CAPS Take 1,000 mg by mouth 2 (two) times daily. Taking 2 capsules twice daily    Historical Provider, MD  ONE TOUCH ULTRA TEST test strip  10/06/14   Historical Provider, MD  TRADJENTA 5 MG TABS tablet Take 5 mg by mouth daily.  10/05/14   Historical Provider, MD  traMADol (ULTRAM) 50 MG tablet Take 1 tablet (50 mg total) by mouth every 6 (six) hours as needed. Patient not taking: Reported on 03/01/2015 11/01/14   Asa Lente, MD  vitamin B-12 (CYANOCOBALAMIN) 1000 MCG tablet Take 1,000 mcg by mouth daily.    Historical Provider, MD   BP 152/73 mmHg  Pulse 84  Resp 15  SpO2 97% Physical Exam  Constitutional: She is oriented to person, place, and time. She appears well-developed and well-nourished.  HENT:  Head:  Normocephalic and atraumatic.  Eyes: EOM are normal. Pupils are equal, round, and reactive to light.  Neck: Normal range of motion. Neck supple.  Cardiovascular: Regular rhythm and normal heart sounds.  Tachycardia present.  Exam reveals no gallop and no friction rub.   No murmur heard. Pulses:      Radial pulses are 2+ on the right side, and 2+ on the left side.  Pulmonary/Chest: Effort normal and breath sounds normal. She has no wheezes. She has no rhonchi. She has no rales.  Abdominal: Soft. There is no tenderness.  Musculoskeletal: Normal range of motion.  LLE: calf tenderness; no unilateral pitting edema  Neurological: She is alert and oriented to person, place, and time.  Skin: Skin is warm and dry.  Psychiatric: She has a normal mood and affect. Her behavior is normal.  Nursing note and vitals reviewed.   ED Course  Procedures (including critical care time) DIAGNOSTIC STUDIES: Oxygen Saturation is 100% on RA, normal by my interpretation.    COORDINATION OF CARE: 2:49 AM-Discussed treatment plan which includes EKG, labs and chest x-ray with pt at bedside and pt agreed to plan.    Labs Review Labs Reviewed  CBC - Abnormal; Notable for the following:    RBC 5.65 (*)    MCV 68.5 (*)    MCH 22.7 (*)    RDW 15.7 (*)    All other components within normal limits  BASIC METABOLIC PANEL  Rosezena Sensor, ED    Imaging Review Dg Chest 2 View  03/28/2015  CLINICAL DATA:  Chest pain. EXAM: CHEST  2 VIEW COMPARISON:  None. FINDINGS: The heart size and mediastinal contours are within normal limits. Both lungs are clear. The visualized skeletal structures are unremarkable. IMPRESSION: No active cardiopulmonary disease. Electronically Signed   By: Ellery Plunk M.D.   On: 03/28/2015 02:37   I have personally reviewed and evaluated these images and lab results as part of my medical decision-making.   EKG Interpretation   Date/Time:  Monday March 28 2015 02:14:26  EDT Ventricular Rate:  88 PR Interval:  136 QRS Duration: 93 QT Interval:  388 QTC Calculation: 469 R Axis:   50 Text Interpretation:  Sinus rhythm ST elevation, consider inferior injury  inferior q wave -  worse than before with subtle ST elevation in leaf III  Nonspecific ST and T wave abnormality - leads anterior and lateral  Confirmed by Rhunette Croft, MD, Walther Sanagustin [phone]) on 03/28/2015 2:28:07 AM      EKG Interpretation  Date/Time:  Monday March 28 2015 02:42:32 EDT Ventricular Rate:  95 PR Interval:  131  QRS Duration: 94 QT Interval:  430 QTC Calculation: 541 R Axis:   49 Text Interpretation:  Sinus rhythm Abnormal inferior Q waves ST elevation suggests acute pericarditis Prolonged QT interval artifact - no significant change since last ekg Confirmed by Rhunette Croft, MD, Janey Genta [phone]) on 03/28/2015 2:56:25 AM        MDM   Final diagnoses:  None    I personally performed the services described in this documentation, which was scribed in my presence. The recorded information has been reviewed and is accurate.  Pt comes in with cc of chest pain. She has hx of DM, HTn, HL, Stroke.  Pt reports L sided chest pain, sharp, with radiation to the L arm earlier today. Her EKG certainly has some subtle changes and possible ischemic changes in the inferior leads (deeperq wave with ST changes in just isolated lead 3) and anterior leads (v2-v6 has slight ST elevation - early repo type).  History is concerning for ACS type chest pain. She has no pain now. She reports that she has been getting tired more easily. Also to consider, since she is not mobile any more is PE. Pt has LLE unilateral tenderness over the calf region - dimer ordered.  PT will need admission for ACS r/o.   Derwood Kaplan, MD 03/28/15 [phone]

## 2015-03-28 NOTE — Clinical Social Work Note (Signed)
Clinical Social Work Assessment  Patient Details  Name: Alicia Mcintyre MRN: [id number] Date of Birth: 21-Dec-1945  Date of referral:  03/28/15               Reason for consult:  Facility Placement, Discharge Planning                Permission sought to share information with:  Facility Sport and exercise psychologist, Family Supports Permission granted to share information::  Yes, Verbal Permission Granted  Name::     Alicia Mcintyre  Agency::  SNF  Relationship::  Spouse  Contact Information:  [phone]  Housing/Transportation Living arrangements for the past 2 months:  [place] of Information:  Patient Patient Interpreter Needed:  None Criminal Activity/Legal Involvement Pertinent to Current Situation/Hospitalization:  No - Comment as needed Significant Relationships:  Adult Children, Spouse Lives with:  Spouse Do you feel safe going back to the place where you live?  Yes Need for family participation in patient care:  Yes (Comment)  Care giving concerns: Patient lives at home with husband. Patient was admitted from [facility] SNF where she was receiving short-term rehab.   Social Worker assessment / plan: BSW intern met with patient at bedside to complete assessment. Before hospitalization and SNF, patient lived at home with patient husband. Patient stated that was completing short-term rehab at [facility]F when she became short of breath. Patient wishes to return to [facility] at time of discharge to complete her rehab. Patient was not sure how long she has been at the facility. BSW intern will continue to follow and assist as needed.   Employment status:  Other (Comment) (Not Employed) Insurance information:  Other (Comment Required), [place] ([employer] HMO) PT Recommendations:  [place] / Referral to community resources:  [place]  Patient/Family's Response to care:  Patient appears to  be appreciative of care she is currently receiving.   Patient/Family's Understanding of and Emotional Response to Diagnosis, Current Treatment, and Prognosis:  Patient seemed to have a good understanding of current treatment and post discharge needs.   Emotional Assessment Appearance:  Appears stated age Attitude/Demeanor/Rapport:  Other (Pleasant and Appropriate) Affect (typically observed):  Appropriate, Pleasant, Calm Orientation:  Oriented to Self, Oriented to Place, Oriented to  Time, Oriented to Situation Alcohol / Substance use:  Not Applicable Psych involvement (Current and /or in the community):  No (Comment)  Discharge Needs  Concerns to be addressed:  No discharge needs identified Readmission within the last 30 days:  No Current discharge risk:  None Barriers to Discharge:  Continued Medical Work up    [employer], [id number]

## 2015-03-28 NOTE — Consult Note (Signed)
CARDIOLOGY CONSULT NOTE   Patient ID: Alicia Mcintyre MRN: [id number] DOB/AGE: [birth year] 70 y.o.  Admit date: 03/28/2015  Primary Physician   No primary care provider on file. Primary Cardiologist   New Reason for Consultation   Chest pain  AOZ:HYQMHPI:Alicia Mcintyre is a 70 y.o. year old female with a history of HTN, HLD, DM. 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 She was d/c'd to a rehab facility.  Yesterday, she had SOB and felt like her throat was closing. She was trying to take deep breaths. She got this feeling at rest yesterday, but has had similar symptoms before, with exertion. She admits to anxiety and worries a great deal. She says staff were trying to calm her down, but symptoms did not improve. Then she developed pain below her L breast. There is some tenderness in her lower L ribs.   EMS was called and she was given ASA 324 mg and SL NTG x 1. Her symptoms improved. She does not remember the meds helping her, but feels the oxygen helped her. The pain did not resolve, but improved. It was initially a 7/10, but is now down to a 3/10. Still with some lower L rib tenderness. Has never had before, the only exertional symptom she has is SOB. She admits to anxiety and worry about things but has no history of panic attacks.    Past Medical History  Diagnosis Date  . Diabetes mellitus without complication (HCC)   . Headache   . Hypertension   . Stroke (HCC)   . Vision abnormalities      Past Surgical History  Procedure Laterality Date  . Partial hysterectomy      has one ovary left/fim  . Appendectomy    . Cataract extraction      Allergies  Allergen Reactions  . Lipitor [Atorvastatin] Shortness Of Breath  . Zocor [Simvastatin] Shortness Of Breath  . Ace Inhibitors Cough  . Diovan [Valsartan] Cough  . Losartan Potassium Cough  . Metformin And Related Diarrhea  . Ranitidine Hcl Other (See Comments)    Headaches    . Actos [Pioglitazone] Rash    I have reviewed the patient's current medications . amLODipine  10 mg Oral Daily  . aspirin  81 mg Oral Daily  . clopidogrel  75 mg Oral Daily  . ezetimibe  10 mg Oral Daily  . ferrous sulfate  325 mg Oral Q breakfast  . gabapentin  100 mg Oral TID  . hydrALAZINE  50 mg Oral 3 times per day  . insulin aspart  0-15 Units Subcutaneous TID WC  . Rivaroxaban  15 mg Oral Once  . vitamin B-12  1,000 mcg Oral Daily   . sodium chloride     acetaminophen, morphine injection, ondansetron (ZOFRAN) IV, traMADol  Medication Sig  amLODipine (NORVASC) 5 MG tablet Take 10 mg by mouth daily.  aspirin 81 MG chewable tablet Chew 81 mg by mouth daily.  canagliflozin (INVOKANA) 100 MG TABS tablet Take 100 mg by mouth daily.  clopidogrel (PLAVIX) 75 MG tablet Take 1 tablet (75 mg total) by mouth daily.  ezetimibe (ZETIA) 10 MG tablet Take 1 tablet (10 mg total) by mouth daily.  ferrous sulfate 325 (65 FE) MG tablet Take 325 mg by mouth daily with breakfast.  gabapentin (NEURONTIN) 100 MG capsule Take 100 mg by mouth 3 (three) times daily.  hydrALAZINE (APRESOLINE) 25 MG tablet Take  50 mg by mouth every 8 (eight) hours.  linagliptin (TRADJENTA) 5 MG TABS tablet Take 5 mg by mouth daily.  Omega-3 Fatty Acids (FISH OIL) 1000 MG CAPS Take 2,000 mg by mouth daily. Taking 2 capsules twice daily  traMADol (ULTRAM) 50 MG tablet Take 1 tablet (50 mg total) by mouth every 6 (six) hours as needed. Patient taking differently: Take 50 mg by mouth every 6 (six) hours as needed.   vitamin B-12 (CYANOCOBALAMIN) 1000 MCG tablet Take 1,000 mcg by mouth daily.     Social History   Social History  . Marital Status: Married    Spouse Name: N/A  . Number of Children: N/A  . Years of Education: N/A   Occupational History  . Retired    Social History Main Topics  . Smoking status: Never Smoker   . Smokeless tobacco: Never Used  . Alcohol Use: No  . Drug Use: No  . Sexual  Activity: Not on file   Other Topics Concern  . Not on file   Social History Narrative   Currently in a facility, after her strokes.    Family Status  Relation Status Death Age  . Mother Deceased   . Father Deceased   . Sister Deceased   . Brother Deceased   . Brother Alive   . Brother Alive   . Brother Alive   . Brother Alive   . Brother Alive   . Sister Alive   . Sister Alive   . Sister Alive   . Sister Alive    Family History  Problem Relation Age of Onset  . Hypertension Mother   . Stroke Mother   . Lupus Mother   . Hypertension Father   . Hypertension Sister   . Sleep apnea Sister   . Stroke Sister   . Diabetes Brother   . Hypertension Brother   . Healthy Brother   . Diabetes Brother   . Healthy Brother   . Healthy Brother   . Diabetes Sister   . Healthy Sister   . Healthy Sister   . Sleep apnea Sister   . Hypertension Sister      ROS:  Full 14 point review of systems complete and found to be negative unless listed above.  Physical Exam: Blood pressure 127/64, pulse 81, temperature 98.4 F (36.9 C), temperature source Oral, resp. rate 22, height  (1.575 m), weight 178 lb 9.6 oz (81.012 kg), SpO2 99 %.  General: Well developed, well nourished, female in no acute distress Head: Eyes PERRLA, No xanthomas.   Normocephalic and atraumatic, oropharynx without edema or exudate. Dentition: poor Lungs: few rales, good air exchange Heart: HRRR S1 S2, no rub/gallop, soft murmur. pulses are 2+ all 4 extrem.   Neck: No carotid bruits. No lymphadenopathy.  JVD not elevated. Abdomen: Bowel sounds present, abdomen soft and non-tender without masses or hernias noted. Msk:  No spine or cva tenderness. No weakness, no joint deformities or effusions. Extremities: No clubbing or cyanosis. No edema.  Neuro: A and O x3  Otherwise deferred   Psych:  Good affect, responds appropriately Skin: No rashes or lesions noted.  Labs:   Lab Results  Component Value Date    WBC 8.2 03/28/2015   HGB 12.8 03/28/2015   HCT 38.7 03/28/2015   MCV 68.5* 03/28/2015   PLT 224 03/28/2015     Recent Labs Lab 03/28/15 0245  NA 140  K 3.5  CL 102  CO2 25  BUN 27*  CREATININE 1.24*  CALCIUM 9.9  GLUCOSE 173*   No results found for: MG  Recent Labs  03/28/15 0620 03/28/15 0852  TROPONINI 0.04* 0.03    Recent Labs  03/28/15 0304  TROPIPOC 0.03   Lab Results  Component Value Date   DDIMER 0.86* 03/28/2015    Echo: ordered (EF was 60-65% 01/2015, with grade 1 dd)  ECG: 03/28/2015 SR, LVH w/ ?early repol - no change from 01/2015  Radiology:  Dg Chest 2 View  03/28/2015  CLINICAL DATA:  Chest pain. EXAM: CHEST  2 VIEW COMPARISON:  None. FINDINGS: The heart size and mediastinal contours are within normal limits. Both lungs are clear. The visualized skeletal structures are unremarkable. IMPRESSION: No active cardiopulmonary disease. Electronically Signed   By: Ellery Plunk M.D.   On: 03/28/2015 02:37    ASSESSMENT AND PLAN:   The patient was seen today by Dr Tenny Craw, the patient evaluated and the data reviewed.  Principal Problem:   Chest pain - atypical.  Prolonged  More complaints of SOB=  No significatn  ECG changes or significant ez elevations On exam, volume status is OK     Prolonged QTc - upon reviewing ECGs in system, feel QT was overestimated, bringing QTc down to normal.  Otherwise, per IM Active Problems:   History of CVA (cerebrovascular accident)   Type 2 diabetes mellitus with complication (HCC)   Essential hypertension   Elevated d-dimer   Signed: Leanna Battles 03/28/2015 11:49 AM Beeper [phone]  Pt seen and examined  I agree wth findings as noted above by R Barrett CP is atypical I am not convinced angina On exam:  Lungs CTA  No wheezes  Cardiac  RRR  No S3 Ext without edema   ? If symptoms of SOB exacerbated by anxiety  Currently comfortable  No symtpms   Would sched echo  Without furhter objective evid or  abnormal echo I would not pursure more invasive evaluation WIll continue to follow.   Dietrich Pates

## 2015-03-28 NOTE — Progress Notes (Signed)
*  PRELIMINARY RESULTS* Vascular Ultrasound Left lower extremity venous duplex has been completed.  Preliminary findings: No evidence of DVT or baker's cyst.   Farrel DemarkJill Eunice, RDMS, RVT  03/28/2015, 9:47 AM

## 2015-03-29 ENCOUNTER — Ambulatory Visit (HOSPITAL_BASED_OUTPATIENT_CLINIC_OR_DEPARTMENT_OTHER): Payer: Medicare HMO

## 2015-03-29 DIAGNOSIS — R079 Chest pain, unspecified: Secondary | ICD-10-CM

## 2015-03-29 DIAGNOSIS — Z8673 Personal history of transient ischemic attack (TIA), and cerebral infarction without residual deficits: Secondary | ICD-10-CM

## 2015-03-29 DIAGNOSIS — R791 Abnormal coagulation profile: Secondary | ICD-10-CM | POA: Diagnosis not present

## 2015-03-29 DIAGNOSIS — I10 Essential (primary) hypertension: Secondary | ICD-10-CM | POA: Diagnosis not present

## 2015-03-29 LAB — BASIC METABOLIC PANEL
ANION GAP: 11 (ref 5–15)
BUN: 24 mg/dL — ABNORMAL HIGH (ref 6–20)
CHLORIDE: 107 mmol/L (ref 101–111)
CO2: 22 mmol/L (ref 22–32)
Calcium: 9.3 mg/dL (ref 8.9–10.3)
Creatinine, Ser: 1.07 mg/dL — ABNORMAL HIGH (ref 0.44–1.00)
GFR calc non Af Amer: 52 mL/min — ABNORMAL LOW (ref >60)
Glucose, Bld: 177 mg/dL — ABNORMAL HIGH (ref 65–99)
POTASSIUM: 3.8 mmol/L (ref 3.5–5.1)
SODIUM: 140 mmol/L (ref 135–145)

## 2015-03-29 LAB — ECHOCARDIOGRAM COMPLETE
Height: 62 in
WEIGHTICAEL: 2867.74 [oz_av]

## 2015-03-29 LAB — GLUCOSE, CAPILLARY
GLUCOSE-CAPILLARY: 134 mg/dL — AB (ref 65–99)
GLUCOSE-CAPILLARY: 123 mg/dL — AB (ref 65–99)

## 2015-03-29 MED ORDER — TRIAMTERENE-HCTZ 37.5-25 MG PO TABS
0.5000 | ORAL_TABLET | Freq: Every day | ORAL | Status: DC
  Administered 2015-03-29: 0.5 via ORAL
  Filled 2015-03-29: qty 0.5

## 2015-03-29 MED ORDER — ROSUVASTATIN CALCIUM 10 MG PO TABS
5.0000 mg | ORAL_TABLET | Freq: Every day | ORAL | Status: DC
Start: 2015-03-29 — End: 2015-03-29

## 2015-03-29 MED ORDER — CYCLOBENZAPRINE HCL 10 MG PO TABS
5.0000 mg | ORAL_TABLET | Freq: Three times a day (TID) | ORAL | Status: DC | PRN
  Administered 2015-03-29: 5 mg via ORAL
  Filled 2015-03-29: qty 1

## 2015-03-29 MED ORDER — CYCLOBENZAPRINE HCL 5 MG PO TABS: 5.0000 mg | ORAL_TABLET | Freq: Three times a day (TID) | ORAL | Status: AC | PRN

## 2015-03-29 MED ORDER — KETOROLAC TROMETHAMINE 10 MG PO TABS: 10.0000 mg | ORAL_TABLET | Freq: Four times a day (QID) | ORAL | Status: DC | PRN

## 2015-03-29 MED ORDER — ROSUVASTATIN CALCIUM 5 MG PO TABS: 5.0000 mg | ORAL_TABLET | Freq: Every day | ORAL | Status: AC

## 2015-03-29 MED ORDER — TRIAMTERENE-HCTZ 37.5-25 MG PO TABS: 0.5000 | ORAL_TABLET | Freq: Every day | ORAL | Status: DC

## 2015-03-29 NOTE — Progress Notes (Signed)
Subjective: No CP  Breathing is OK   Objective: Filed Vitals:   03/28/15 1332 03/28/15 1539 03/28/15 2122 03/29/15 0600  BP: 128/68 152/59 160/44 146/102  Pulse: 80  92 90  Temp: 98 F (36.7 C)  98.1 F (36.7 C) 98.2 F (36.8 C)  TempSrc: Oral  Oral   Resp: 20     Height:      Weight:    179 lb 3.7 oz (81.3 kg)  SpO2: 98%  99%    Weight change: 10.1 oz (0.287 kg)  Intake/Output Summary (Last 24 hours) at 03/29/15 0814 Last data filed at 03/29/15 0732  Gross per 24 hour  Intake    440 ml  Output   2350 ml  Net  -1910 ml    General: Alert, awake, oriented x3, in no acute distress Neck:  JVP is normal Heart: Regular rate and rhythm, without murmurs, rubs, gallops.  Lungs: Clear to auscultation.  No rales or wheezes. Exemities:  No edema.   Neuro: Grossly intact, nonfocal.  Tele:  SR   Lab Results: Results for orders placed or performed during the hospital encounter of 03/28/15 (from the past 24 hour(s))  Troponin I-serum (0, 3, 6 hours)     Status: None   Collection Time: 03/28/15  8:52 AM  Result Value Ref Range   Troponin I 0.03 <0.031 ng/mL  Glucose, capillary     Status: Abnormal   Collection Time: 03/28/15 11:38 AM  Result Value Ref Range   Glucose-Capillary 116 (H) 65 - 99 mg/dL  Troponin I-serum (0, 3, 6 hours)     Status: None   Collection Time: 03/28/15 11:50 AM  Result Value Ref Range   Troponin I 0.03 <0.031 ng/mL  Glucose, capillary     Status: Abnormal   Collection Time: 03/28/15  4:19 PM  Result Value Ref Range   Glucose-Capillary 181 (H) 65 - 99 mg/dL  Glucose, capillary     Status: Abnormal   Collection Time: 03/28/15  9:21 PM  Result Value Ref Range   Glucose-Capillary 140 (H) 65 - 99 mg/dL  Basic metabolic panel     Status: Abnormal   Collection Time: 03/29/15  3:08 AM  Result Value Ref Range   Sodium 140 135 - 145 mmol/L   Potassium 3.8 3.5 - 5.1 mmol/L   Chloride 107 101 - 111 mmol/L   CO2 22 22 - 32 mmol/L   Glucose, Bld 177 (H)  65 - 99 mg/dL   BUN 24 (H) 6 - 20 mg/dL   Creatinine, Ser 1.61 (H) 0.44 - 1.00 mg/dL   Calcium 9.3 8.9 - 09.6 mg/dL   GFR calc non Af Amer 52 (L) >60 mL/min   GFR calc Af Amer >60 >60 mL/min   Anion gap 11 5 - 15  Glucose, capillary     Status: Abnormal   Collection Time: 03/29/15  7:28 AM  Result Value Ref Range   Glucose-Capillary 134 (H) 65 - 99 mg/dL    Studies/Results: Nm Pulmonary Perf And Vent  03/28/2015  CLINICAL DATA:  Elevated D-dimer, chest pain, shortness of breath. EXAM: NUCLEAR MEDICINE VENTILATION - PERFUSION LUNG SCAN TECHNIQUE: Ventilation images were obtained in multiple projections using inhaled aerosol Tc-80m DTPA. Perfusion images were obtained in multiple projections after intravenous injection of Tc-81m MAA. RADIOPHARMACEUTICALS:  32.0 mCi Technetium-82m DTPA aerosol inhalation and 4.2 mCi Technetium-57m MAA IV COMPARISON:  Correlation made chest radiographs dated 03/28/2015 FINDINGS: Ventilation: Mildly heterogeneous ventilation. No focal ventilation defect. Perfusion: Normal  perfusion. No wedge shaped peripheral perfusion defects to suggest acute pulmonary embolism. IMPRESSION: Negative for pulmonary embolism. Electronically Signed   By: Charline BillsSriyesh  Krishnan M.D.   On: 03/28/2015 15:36    Medications: Reviewed    @PROBHOSP @  1  CP PT denies  Echo pending  No recomm beyond that in consult  WIlll follow up on reuslts.    2  HTN  BP remains high    On amlodipine and hydralazine  WOuld  Add low dose maxzide to regimen and follow  (ARB leads to cough)  1/2 of 37/5 / 25 Maxzide    3  CVA  On rivaroxiban  4  HL  LDL in Jan is 137  Total chol was 218    Pt ws sob with lipitor and zocor  I would try crestor with different metabolism and chemical  structure  5 mg  Follow closely for side effects      Dietrich Patesaula Lizza Huffaker 03/29/2015, 8:14 AM

## 2015-03-29 NOTE — Progress Notes (Signed)
Report called to facility accepting patient. No further questions at this time.  

## 2015-03-29 NOTE — Clinical Social Work Placement (Signed)
   CLINICAL SOCIAL WORK PLACEMENT  NOTE  Date:  03/29/2015  Patient Details  Name: Alicia Mcintyre MRN: [id number] Date of Birth: 1945/03/22  Clinical Social Work is seeking post-discharge placement for this patient at the Skilled  Nursing Facility level of care (*CSW will initial, date and re-position this form in  chart as items are completed):  Yes   Patient/family provided with [facility] Clinical Social Work Department's list of facilities offering this level of care within the geographic area requested by the patient (or if unable, by the patient's family).  Yes   Patient/family informed of their freedom to choose among providers that offer the needed level of care, that participate in Medicare, Medicaid or managed care program needed by the patient, have an available bed and are willing to accept the patient.  Yes   Patient/family informed of [facility]'s ownership interest in [facility] and [facility], as well as of the fact that they are under no obligation to receive care at these facilities.  PASRR submitted to EDS on       PASRR number received on       Existing PASRR number confirmed on 03/28/15     FL2 transmitted to all facilities in geographic area requested by pt/family on 03/28/15     FL2 transmitted to all facilities within larger geographic area on       Patient informed that his/her managed care company has contracts with or will negotiate with certain facilities, including the following:        Yes   Patient/family informed of bed offers received.  [facility]     Physician recommends and patient chooses bed at      [name].  Patient to be transferred to facility by [facility]     Patient family notified on 03/29/15 of transfer.  Name of family member notified:  Spouse     PHYSICIAN Please sign DNR, Please prepare prescriptions     Additional Comment:   Per MD, patient is  [facility]. RN, Patient/family, and facility have been notified of patient discharge. RN given number for report. D/C packet on chart. Ambulance transport requested for patient. BSW Intern signing off.  _______________________________________________ Alicia Mcintyre, Student-SW 03/29/2015, 4:09 PM

## 2015-03-29 NOTE — Progress Notes (Signed)
  Echocardiogram 2D Echocardiogram has been performed.  Cathie BeamsGREGORY, Carleen Rhue 03/29/2015, 10:46 AM

## 2015-03-29 NOTE — Discharge Summary (Signed)
Physician Discharge Summary  Alicia Mcintyre ZOX:[id number] DOB: [birth year] DOA: 03/28/2015  PCP: No primary care provider on file.  Admit date: 03/28/2015 Discharge date: 03/29/2015  Time spent: 20 minutes  Recommendations for Outpatient Follow-up:  1. Follow up with PCP in 2-3 weeks 2. Please monitor for side effects as patient is now started on crestor for HLD  Discharge Diagnoses:  Principal Problem:   Chest pain Active Problems:   History of CVA (cerebrovascular accident)   Type 2 diabetes mellitus with complication [facility])   Essential hypertension   Elevated d-dimer   Discharge Condition: Stable  Diet recommendation: Heart healthy, diabetic  Filed Weights   03/28/15 0600 03/29/15 0600  Weight: 81.012 kg (178 lb 9.6 oz) 81.3 kg (179 lb 3.7 oz)    History of present illness:  Please review dictated H and P from 3/20 for details. Briefly, 70 year old female with a past medical history significant for right MCA/PCA stroke with left hemiparesis, hyperlipidemia, diabetes mellitus, hypertension; who presents acutely with complaints of chest pain from the skilled nursing facility.  Hospital Course:  Chest pain - Continued with intermittent chest pains with unclear source. Seems to radiate from LUE up through head, raising question of neuropathic pain vs migraines? Consider a trial of muscle relaxants and/or toradol as tolerated - Cardiology following - 2d echo with normal EF and no WMA, essentially unremarkable - VQ was neg for PE - Serial trop unremarkable Elevated d-dimer - LE dopplers neg - VQ with no PE - Possible secondary to recent CVA AKI - Avoid nephrotoxins - Renal function improved Recent CVA - seems stable at present DM2 -Cont on SSI coverage -Resume home meds on discharge HTN - BP elevated - Maxzide added by Cardiology  Procedures:  2d Echo  LE dopplers  VQ scan  Consultations:  Cardiology  Discharge Exam: Filed Vitals:   03/28/15 1539  03/28/15 2122 03/29/15 0600 03/29/15 1359  BP: 152/59 160/44 146/102 140/89  Pulse:  92 90 88  Temp:  98.1 F (36.7 C) 98.2 F (36.8 C) 98 F (36.7 C)  TempSrc:  Oral  Oral  Resp:    20  Height:      Weight:   81.3 kg (179 lb 3.7 oz)   SpO2:  99%  99%    General: Awake, in nad Cardiovascular: regular, s1, s2 Respiratory: normal resp effort, no wheezing  Discharge Instructions     Medication List    TAKE these medications        amLODipine 5 MG tablet  Commonly known as:  NORVASC  Take 10 mg by mouth daily.     aspirin 81 MG chewable tablet  Chew 81 mg by mouth daily.     clopidogrel 75 MG tablet  Commonly known as:  PLAVIX  Take 1 tablet (75 mg total) by mouth daily.     cyclobenzaprine 5 MG tablet  Commonly known as:  FLEXERIL  Take 1 tablet (5 mg total) by mouth 3 (three) times daily as needed for muscle spasms.     ezetimibe 10 MG tablet  Commonly known as:  ZETIA  Take 1 tablet (10 mg total) by mouth daily.     ferrous sulfate 325 (65 FE) MG tablet  Take 325 mg by mouth daily with breakfast.     Fish Oil 1000 MG Caps  Take 2,000 mg by mouth daily. Taking 2 capsules twice daily     gabapentin 100 MG capsule  Commonly known as:  NEURONTIN  Take 100 mg  by mouth 3 (three) times daily.     hydrALAZINE 25 MG tablet  Commonly known as:  APRESOLINE  Take 50 mg by mouth every 8 (eight) hours.     INVOKANA 100 MG Tabs tablet  Generic drug:  canagliflozin  Take 100 mg by mouth daily.     ketorolac 10 MG tablet  Commonly known as:  TORADOL  Take 1 tablet (10 mg total) by mouth every 6 (six) hours as needed.     linagliptin 5 MG Tabs tablet  Commonly known as:  TRADJENTA  Take 5 mg by mouth daily.     rosuvastatin 5 MG tablet  Commonly known as:  CRESTOR  Take 1 tablet (5 mg total) by mouth daily at 6 PM.     traMADol 50 MG tablet  Commonly known as:  ULTRAM  Take 1 tablet (50 mg total) by mouth every 6 (six) hours as needed.      triamterene-hydrochlorothiazide 37.5-25 MG tablet  Commonly known as:  MAXZIDE-25  Take 0.5 tablets by mouth daily.     vitamin B-12 1000 MCG tablet  Commonly known as:  CYANOCOBALAMIN  Take 1,000 mcg by mouth daily.       Allergies  Allergen Reactions  . Lipitor [Atorvastatin] Shortness Of Breath  . Zocor [Simvastatin] Shortness Of Breath  . Ace Inhibitors Cough  . Diovan [Valsartan] Cough  . Losartan Potassium Cough  . Metformin And Related Diarrhea  . Ranitidine Hcl Other (See Comments)    Headaches  . Actos [Pioglitazone] Rash   Follow-up Information    Follow up with Follow up with PCP In 2-3 weeks. Schedule an appointment as soon as possible for a visit in 2 weeks.   Why:  Hospital follow up       The results of significant diagnostics from this hospitalization (including imaging, microbiology, ancillary and laboratory) are listed below for reference.    Significant Diagnostic Studies: Dg Chest 2 View  03/28/2015  CLINICAL DATA:  Chest pain. EXAM: CHEST  2 VIEW COMPARISON:  None. FINDINGS: The heart size and mediastinal contours are within normal limits. Both lungs are clear. The visualized skeletal structures are unremarkable. IMPRESSION: No active cardiopulmonary disease. Electronically Signed   By: Ellery Plunk M.D.   On: 03/28/2015 02:37   Nm Pulmonary Perf And Vent  03/28/2015  CLINICAL DATA:  Elevated D-dimer, chest pain, shortness of breath. EXAM: NUCLEAR MEDICINE VENTILATION - PERFUSION LUNG SCAN TECHNIQUE: Ventilation images were obtained in multiple projections using inhaled aerosol Tc-71m DTPA. Perfusion images were obtained in multiple projections after intravenous injection of Tc-72m MAA. RADIOPHARMACEUTICALS:  32.0 mCi Technetium-18m DTPA aerosol inhalation and 4.2 mCi Technetium-42m MAA IV COMPARISON:  Correlation made chest radiographs dated 03/28/2015 FINDINGS: Ventilation: Mildly heterogeneous ventilation. No focal ventilation defect. Perfusion: Normal  perfusion. No wedge shaped peripheral perfusion defects to suggest acute pulmonary embolism. IMPRESSION: Negative for pulmonary embolism. Electronically Signed   By: Charline Bills M.D.   On: 03/28/2015 15:36    Microbiology: No results found for this or any previous visit (from the past 240 hour(s)).   Labs: Basic Metabolic Panel:  Recent Labs Lab 03/28/15 0245 03/29/15 0308  NA 140 140  K 3.5 3.8  CL 102 107  CO2 25 22  GLUCOSE 173* 177*  BUN 27* 24*  CREATININE 1.24* 1.07*  CALCIUM 9.9 9.3   Liver Function Tests: No results for input(s): AST, ALT, ALKPHOS, BILITOT, PROT, ALBUMIN in the last 168 hours. No results for input(s): LIPASE, AMYLASE  in the last 168 hours. No results for input(s): AMMONIA in the last 168 hours. CBC:  Recent Labs Lab 03/28/15 0245  WBC 8.2  HGB 12.8  HCT 38.7  MCV 68.5*  PLT 224   Cardiac Enzymes:  Recent Labs Lab 03/28/15 0620 03/28/15 0852 03/28/15 1150  TROPONINI 0.04* 0.03 0.03   BNP: BNP (last 3 results) No results for input(s): BNP in the last 8760 hours.  ProBNP (last 3 results) No results for input(s): PROBNP in the last 8760 hours.  CBG:  Recent Labs Lab 03/28/15 1138 03/28/15 1619 03/28/15 2121 03/29/15 0728 03/29/15 1134  GLUCAP 116* 181* 140* 134* 123*    Signed:  Nandi Tonnesen K  [facility] 03/29/2015, 2:41 PM

## 2015-05-24 ENCOUNTER — Telehealth: Payer: Self-pay | Admitting: Neurology

## 2015-05-24 NOTE — Telephone Encounter (Signed)
°  Pt' son called:  [phone]  WHEN IS PT- APPT   I called pt this morning and spoke with son. Gave appt date and time. He expressed understanding

## 2015-06-01 ENCOUNTER — Ambulatory Visit (INDEPENDENT_AMBULATORY_CARE_PROVIDER_SITE_OTHER): Payer: Medicare HMO | Admitting: Neurology

## 2015-06-01 ENCOUNTER — Encounter: Payer: Self-pay | Admitting: Neurology

## 2015-06-01 VITALS — BP 108/60 | HR 64 | Resp 18 | Ht 62.0 in | Wt 179.0 lb

## 2015-06-01 DIAGNOSIS — M6289 Other specified disorders of muscle: Secondary | ICD-10-CM | POA: Diagnosis not present

## 2015-06-01 DIAGNOSIS — G4733 Obstructive sleep apnea (adult) (pediatric): Secondary | ICD-10-CM

## 2015-06-01 DIAGNOSIS — R269 Unspecified abnormalities of gait and mobility: Secondary | ICD-10-CM | POA: Diagnosis not present

## 2015-06-01 DIAGNOSIS — Z8673 Personal history of transient ischemic attack (TIA), and cerebral infarction without residual deficits: Secondary | ICD-10-CM | POA: Diagnosis not present

## 2015-06-01 DIAGNOSIS — M79645 Pain in left finger(s): Secondary | ICD-10-CM

## 2015-06-01 DIAGNOSIS — R531 Weakness: Secondary | ICD-10-CM

## 2015-06-01 MED ORDER — CLOPIDOGREL BISULFATE 75 MG PO TABS: 75.0000 mg | ORAL_TABLET | Freq: Every day | ORAL | Status: AC

## 2015-06-01 NOTE — Progress Notes (Signed)
[facility]  PATIENT: Alicia Mcintyre DOB: 11-Mar-1945  REFERRING DOCTOR OR PCP:  Dr. Jethro Poling [facility]) SOURCE: patient and records from [facility]  _________________________________   HISTORICAL  CHIEF COMPLAINT:  Chief Complaint  Patient presents with  . Hospitalization Follow-up    Alicia Mcintyre is here for f/u of CVA in March. Residual left sided weakness.  She is receiving physical therapy services at home.  Sts. she continues Plavix and baby asa as rx'd.  Has not used CPAP since stroke in March 2017./fim  . Cerebrovascular Accident  . Sleep Apnea    HISTORY OF PRESENT ILLNESS:  Alicia Mcintyre s a 70 yo woman who I have previously seen at [facility] for a h/o CVA and OSA.     CVA:   She had another stroke in March 2017 with left sided weakness and slurred.   She went to a skilled nursing facility afterwards and had additional therapy after discharge.     MRI the next day 03/10/15 showed old right PCA stroke and more recent extension to complete PCA distribution.      She was on aspirin and Plavix before the 2017 CVA and continues on it.    She does not think there was further change in her vision (already had left visual field defect form 2012 CVA.       She does not note items to the left of midline.   She has been on Plavix 75 mg daily. She has not noted any more TIAs or strokes since the event 4 years ago.   Notes from her hospital stay were reviewed on EMR  OSA:   A polysomnogram 12/22/2012 showed severe OSA with an AHI equals 82. She was titrated to CPAP 10 cm. She was very compliant with CPAP using it for the entire night before the recent stroke but has not used it afterwards.    She has no difficulty tolerating the CPAP. She gets her supplies from [place].  MRI:   I personally reviewed the MRI of the brain from her recent hospitalization and the previous MRI for comparison. The MRI shows a acute stroke involving the occipital lobe (superimposed on  the chronic stroke), mesial posterior temporal lobe and the right cerebral peduncle. There are chronic strokes involving the right occipital lobe and bilateral cerebellum.  REVIEW OF SYSTEMS: Constitutional: No fevers, chills, sweats, or change in appetite Eyes: No visual changes, double vision, eye pain Ear, nose and throat: No hearing loss, ear pain, nasal congestion, sore throat Cardiovascular: No chest pain, palpitations Respiratory: No shortness of breath at rest or with exertion.   No wheezes.   Has OSA GastrointestinaI: No nausea, vomiting, diarrhea, abdominal pain, fecal incontinence Genitourinary: No dysuria, urinary retention or frequency.  No nocturia. Musculoskeletal: No neck pain, back pain Integumentary: No rash, pruritus, skin lesions Neurological: as above Psychiatric: Notes mild  depression  Endocrine: No palpitations, diaphoresis, change in appetite, change in weigh or increased thirst Hematologic/Lymphatic: No anemia, purpura, petechiae. Allergic/Immunologic: No itchy/runny eyes, nasal congestion, recent allergic reactions, rashes  ALLERGIES: Allergies  Allergen Reactions  . Lipitor [Atorvastatin] Shortness Of Breath  . Zocor [Simvastatin] Shortness Of Breath  . Ace Inhibitors Cough  . Diovan [Valsartan] Cough  . Losartan Potassium Cough  . Metformin And Related Diarrhea  . Ranitidine Hcl Other (See Comments)    Headaches  . Actos [Pioglitazone] Rash    HOME MEDICATIONS:  Current outpatient prescriptions:  .  amLODipine (NORVASC) 5 MG tablet, Take 10 mg  by mouth daily., Disp: , Rfl:  .  aspirin 81 MG chewable tablet, Chew 81 mg by mouth daily., Disp: , Rfl:  .  canagliflozin (INVOKANA) 100 MG TABS tablet, Take 100 mg by mouth daily., Disp: , Rfl:  .  clopidogrel (PLAVIX) 75 MG tablet, Take 1 tablet (75 mg total) by mouth daily., Disp: 30 tablet, Rfl: 11 .  cyclobenzaprine (FLEXERIL) 5 MG tablet, Take 1 tablet (5 mg total) by mouth 3 (three) times daily as  needed for muscle spasms., Disp: 20 tablet, Rfl: 0 .  ezetimibe (ZETIA) 10 MG tablet, Take 1 tablet (10 mg total) by mouth daily., Disp: 30 tablet, Rfl: 5 .  ferrous sulfate 325 (65 FE) MG tablet, Take 325 mg by mouth daily with breakfast., Disp: , Rfl:  .  gabapentin (NEURONTIN) 100 MG capsule, Take 100 mg by mouth 3 (three) times daily., Disp: , Rfl:  .  hydrALAZINE (APRESOLINE) 25 MG tablet, Take 50 mg by mouth every 8 (eight) hours., Disp: , Rfl:  .  linagliptin (TRADJENTA) 5 MG TABS tablet, Take 5 mg by mouth daily., Disp: , Rfl:  .  metoprolol succinate (TOPROL-XL) 25 MG 24 hr tablet, , Disp: , Rfl:  .  Omega-3 Fatty Acids (FISH OIL) 1000 MG CAPS, Take 2,000 mg by mouth daily. Taking 2 capsules twice daily, Disp: , Rfl:  .  rosuvastatin (CRESTOR) 5 MG tablet, Take 1 tablet (5 mg total) by mouth daily at 6 PM., Disp: 30 tablet, Rfl: 0 .  vitamin B-12 (CYANOCOBALAMIN) 1000 MCG tablet, Take 1,000 mcg by mouth daily., Disp: , Rfl:   PAST MEDICAL HISTORY: Past Medical History  Diagnosis Date  . Diabetes mellitus without complication (HCC)   . Headache   . Hypertension   . Stroke (HCC)   . Vision abnormalities     PAST SURGICAL HISTORY: Past Surgical History  Procedure Laterality Date  . Partial hysterectomy      has one ovary left/fim  . Appendectomy    . Cataract extraction      FAMILY HISTORY: Family History  Problem Relation Age of Onset  . Hypertension Mother   . Stroke Mother   . Lupus Mother   . Hypertension Father   . Hypertension Sister   . Sleep apnea Sister   . Stroke Sister   . Diabetes Brother   . Hypertension Brother   . Healthy Brother   . Diabetes Brother   . Healthy Brother   . Healthy Brother   . Diabetes Sister   . Healthy Sister   . Healthy Sister   . Sleep apnea Sister   . Hypertension Sister     SOCIAL HISTORY:  Social History   Social History  . Marital Status: Married    Spouse Name: N/A  . Number of Children: N/A  . Years of  Education: N/A   Occupational History  . Retired    Social History Main Topics  . Smoking status: Never Smoker   . Smokeless tobacco: Never Used  . Alcohol Use: No  . Drug Use: No  . Sexual Activity: Not on file   Other Topics Concern  . Not on file   Social History Narrative   Currently in a facility, after her strokes.     PHYSICAL EXAM  Filed Vitals:   06/01/15 0943  BP: 108/60  Pulse: 64  Resp: 18  Height: 5\' 2"  (1.575 m)  Weight: 179 lb (81.194 kg)    Body mass index is 32.73 kg/(m^2).  General: The patient is well-developed and well-nourished and in no acute distress   Skin: Extremities are without significant edema.  Neurologic Exam  Mental status: The patient is alert and oriented x 3 at the time of the examination. The patient has apparent normal recent and remote memory, with an apparently normal attention span and concentration ability.   Speech is normal.  Cranial nerves: Extraocular movements are full.    There is  allodynia over left V2 distribution.   Facial strength is mildly reduced on the left.  Trapezius and sternocleidomastoid strength is normal. No dysarthria is noted.  The tongue is midline, and the patient has symmetric elevation of the soft palate. No obvious hearing deficits are noted.  Motor:  Muscle bulk is normal.   Tone is increased on the left. Strength is  5 / 5 on the right but 3 to 4-/5 on the left.      Sensory: Sensory testing is intact to pinprick, soft touch and vibration sensation ion the right but moderately reduced touch and reduced vibration sensation on the left..  Coordination: Cerebellar testing reveals good right finger-nose-finger and heel-to-shin .  Poor FYN and can't do HTS on left.  Gait and station: Station is normal.   Gait requires bilateral support. . Romberg is positive.   Reflexes: Deep tendon reflexes are increased on legs, left > right and increased on left arm   Left Plantar response is  extensor.    DIAGNOSTIC DATA (LABS, IMAGING, TESTING) - I reviewed patient records, labs, notes, testing and imaging myself where available.     ASSESSMENT AND PLAN  History of CVA (cerebrovascular accident)  Left-sided weakness  OSA (obstructive sleep apnea)  Gait disturbance  Pain of left thumb   1.   Set up PT at the [facility] for gait. 2.   Continue ASA and  Plavix 3.   If pain worsens, consider gabapentin or lamotrigine 4.   Resume CPAP for OSA 5.   Left AFO brace  rtc 6 months, sooner if new or worsening neurologic issues  45 minute face-to-face evaluation with greater than one half of the time counseling and coordinating care about her recent stroke and gait disturbance.  Yliana Gravois A. Epimenio Foot, MD, PhD 06/01/2015, 9:48 AM Certified in Neurology, Clinical Neurophysiology, Sleep Medicine, Pain Medicine and Neuroimaging  [facility] [address], [address] [place], [place] [address] [phone]

## 2015-06-03 ENCOUNTER — Telehealth: Payer: Self-pay | Admitting: *Deleted

## 2015-06-03 NOTE — Telephone Encounter (Signed)
Order signed and faxed along with most recent office note. Received fax confirmation. [phone]

## 2015-06-13 ENCOUNTER — Telehealth: Payer: Self-pay | Admitting: Neurology

## 2015-06-13 NOTE — Telephone Encounter (Signed)
Don with [facility] is calling and asking if OP occupational therapy has been ordered for the patient's left arm and shoulder or will PT cover that. Please call.

## 2015-06-13 NOTE — Telephone Encounter (Signed)
LMOM for Alicia Mcintyre that [facility] has ordered PT, but will also sign OT orders if OT feels they can provide more assistance/fim

## 2015-06-15 NOTE — Telephone Encounter (Signed)
Alicia Mcintyre returned your call. Marland Kitchen. He said if PT is addressing arm exercises that is all he would suggest. Please call him

## 2015-06-16 NOTE — Telephone Encounter (Signed)
LMOM.  Pt. is receiving PT in home.  Does not require OT services at this time/fim

## 2015-06-16 NOTE — Telephone Encounter (Signed)
Alicia Mcintyre wih [facility] is returning your call. He says no need for OT unless PT does not address left upper extremities. Please call and advise.

## 2015-06-16 NOTE — Telephone Encounter (Signed)
LMTC if there is something specific he needs.Alicia Mcintyre/fim

## 2015-11-01 ENCOUNTER — Ambulatory Visit: Payer: Medicare HMO | Admitting: Neurology

## 2015-11-23 ENCOUNTER — Ambulatory Visit: Payer: Medicare HMO | Admitting: Neurology

## 2015-12-09 DEATH — deceased

## 2016-12-18 IMAGING — NM NM PULMONARY VENT & PERF
12 series · 12 of 12 positions shown · non-contrast
Comparison: Correlation made chest radiographs dated 03/28/2015

CLINICAL DATA: Elevated D-dimer, chest pain, shortness of breath.

EXAM:
NUCLEAR MEDICINE VENTILATION - PERFUSION LUNG SCAN
TECHNIQUE: Ventilation images were obtained in multiple projections using
inhaled aerosol Fc-77m DTPA. Perfusion images were obtained in
multiple projections after intravenous injection of Fc-77m MAA.
RADIOPHARMACEUTICALS:  32.0 mCi Vechnetium-SSm DTPA aerosol
inhalation and 4.2 mCi Vechnetium-SSm MAA IV

[Series 1: ant/post vent · 4.14mm/px · 1 of 1 slices shown (1 of 2)]
[im 1/1  full-range]
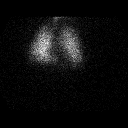

[Series 1: ant/post vent · 4.14mm/px · 1 of 1 slices shown (2 of 2)]
[im 1/1  full-range]
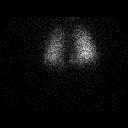

[Series 2: lao/rpo vent · 4.14mm/px · 1 of 1 slices shown (1 of 2)]
[im 1/1  full-range]
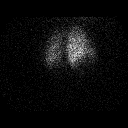

[Series 2: lao/rpo vent · 4.14mm/px · 1 of 1 slices shown (2 of 2)]
[im 1/1  full-range]
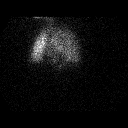

[Series 3: lpo/rao vent · 4.14mm/px · 1 of 1 slices shown (1 of 2)]
[im 1/1  full-range]
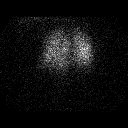

[Series 3: lpo/rao vent · 4.14mm/px · 1 of 1 slices shown (2 of 2)]
[im 1/1  full-range]
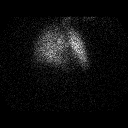

[Series 4: lpo/rao perf · 4.14mm/px · 1 of 1 slices shown (1 of 2)]
[im 1/1]
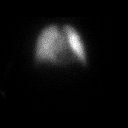

[Series 4: lpo/rao perf · 4.14mm/px · 1 of 1 slices shown (2 of 2)]
[im 1/1]
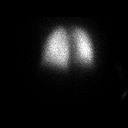

[Series 5: ant/post perf · 4.14mm/px · 1 of 1 slices shown (1 of 2)]
[im 1/1]
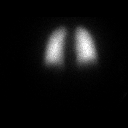

[Series 5: ant/post perf · 4.14mm/px · 1 of 1 slices shown (2 of 2)]
[im 1/1]
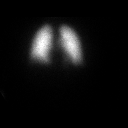

[Series 6: lao/rpo perf · 4.14mm/px · 1 of 1 slices shown (1 of 2)]
[im 1/1]
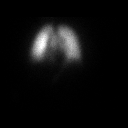

[Series 6: lao/rpo perf · 4.14mm/px · 1 of 1 slices shown (2 of 2)]
[im 1/1]
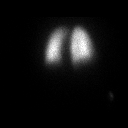

[12 of 12 positions shown; findings below may reference images not displayed]

FINDINGS: Ventilation: Mildly heterogeneous ventilation. No focal ventilation
defect.

Perfusion: Normal perfusion. No wedge shaped peripheral perfusion
defects to suggest acute pulmonary embolism.
IMPRESSION: Negative for pulmonary embolism.
